# Patient Record
Sex: Female | Born: 1955 | Race: Black or African American | Hispanic: No | Smoking: Never smoker
Health system: Southern US, Community
[De-identification: ages and names within clinical notes are randomized; demographics above are authoritative.]

## PROBLEM LIST (undated history)

## (undated) DIAGNOSIS — C50919 Malignant neoplasm of unspecified site of unspecified female breast: Secondary | ICD-10-CM

## (undated) DIAGNOSIS — Z9889 Other specified postprocedural states: Secondary | ICD-10-CM

## (undated) DIAGNOSIS — R112 Nausea with vomiting, unspecified: Secondary | ICD-10-CM

## (undated) DIAGNOSIS — I10 Essential (primary) hypertension: Secondary | ICD-10-CM

## (undated) HISTORY — PX: PARTIAL HYSTERECTOMY: SHX80

## (undated) HISTORY — PX: APPENDECTOMY: SHX54

## (undated) HISTORY — PX: BREAST SURGERY: SHX581

---

## 2006-04-14 ENCOUNTER — Ambulatory Visit: Payer: Self-pay

## 2007-07-02 ENCOUNTER — Ambulatory Visit: Payer: Self-pay | Admitting: Internal Medicine

## 2007-07-02 ENCOUNTER — Other Ambulatory Visit: Payer: Self-pay

## 2007-08-10 ENCOUNTER — Ambulatory Visit: Payer: Self-pay | Admitting: General Surgery

## 2007-09-29 ENCOUNTER — Ambulatory Visit: Payer: Self-pay | Admitting: Internal Medicine

## 2007-11-02 ENCOUNTER — Ambulatory Visit: Payer: Self-pay | Admitting: Vascular Surgery

## 2011-01-12 ENCOUNTER — Emergency Department: Payer: Self-pay | Admitting: Emergency Medicine

## 2011-12-24 HISTORY — PX: COLONOSCOPY: SHX174

## 2012-12-23 HISTORY — PX: BREAST SURGERY: SHX581

## 2012-12-23 HISTORY — PX: OTHER SURGICAL HISTORY: SHX169

## 2013-04-15 DIAGNOSIS — D0510 Intraductal carcinoma in situ of unspecified breast: Secondary | ICD-10-CM | POA: Insufficient documentation

## 2013-04-15 DIAGNOSIS — Z9889 Other specified postprocedural states: Secondary | ICD-10-CM | POA: Insufficient documentation

## 2015-01-12 ENCOUNTER — Ambulatory Visit: Payer: Self-pay | Admitting: General Practice

## 2015-10-17 ENCOUNTER — Other Ambulatory Visit: Payer: Self-pay | Admitting: Internal Medicine

## 2015-10-17 ENCOUNTER — Encounter: Payer: Self-pay | Admitting: Internal Medicine

## 2015-10-17 DIAGNOSIS — C50919 Malignant neoplasm of unspecified site of unspecified female breast: Secondary | ICD-10-CM | POA: Insufficient documentation

## 2015-10-17 DIAGNOSIS — I6782 Cerebral ischemia: Secondary | ICD-10-CM | POA: Insufficient documentation

## 2015-10-17 DIAGNOSIS — D473 Essential (hemorrhagic) thrombocythemia: Secondary | ICD-10-CM | POA: Insufficient documentation

## 2015-10-17 DIAGNOSIS — I10 Essential (primary) hypertension: Secondary | ICD-10-CM | POA: Insufficient documentation

## 2015-10-17 DIAGNOSIS — G939 Disorder of brain, unspecified: Secondary | ICD-10-CM | POA: Insufficient documentation

## 2015-11-01 ENCOUNTER — Ambulatory Visit: Payer: Self-pay | Admitting: Physician Assistant

## 2015-11-01 ENCOUNTER — Encounter: Payer: Self-pay | Admitting: Physician Assistant

## 2015-11-01 VITALS — BP 110/65 | HR 82 | Temp 98.8°F

## 2015-11-01 DIAGNOSIS — M545 Low back pain, unspecified: Secondary | ICD-10-CM

## 2015-11-01 DIAGNOSIS — N390 Urinary tract infection, site not specified: Secondary | ICD-10-CM

## 2015-11-01 LAB — POCT URINALYSIS DIPSTICK
Bilirubin, UA: NEGATIVE
Blood, UA: NEGATIVE
Glucose, UA: NEGATIVE
KETONES UA: NEGATIVE
NITRITE UA: NEGATIVE
PROTEIN UA: NEGATIVE
Spec Grav, UA: 1.02
UROBILINOGEN UA: 0.2
pH, UA: 6

## 2015-11-01 MED ORDER — CYCLOBENZAPRINE HCL 10 MG PO TABS: 10.0000 mg | ORAL_TABLET | Freq: Three times a day (TID) | ORAL | Status: DC | PRN

## 2015-11-01 MED ORDER — METHYLPREDNISOLONE 4 MG PO TBPK: ORAL_TABLET | ORAL | Status: DC

## 2015-11-01 MED ORDER — CIPROFLOXACIN HCL 500 MG PO TABS: 500.0000 mg | ORAL_TABLET | Freq: Two times a day (BID) | ORAL | Status: DC

## 2015-11-01 NOTE — Progress Notes (Signed)
S: c/o b/l back pain, upper around ribs, radiates around ribs b/l, no cp/sob, no cough or congestion, pain increased with movement, also urinary freq, no fever/chills  O: vitals wnl, nad, spine nontender, ribs nontender, pain reproduced with movement, n/v intact , ua trace leuks  A: back pain, uti  P: flexeril 10mg  tid , medrol dose pack, cipro 500mg  bid

## 2016-09-17 ENCOUNTER — Encounter: Payer: Self-pay | Admitting: Physician Assistant

## 2016-09-17 ENCOUNTER — Ambulatory Visit: Payer: Self-pay | Admitting: Physician Assistant

## 2016-09-17 VITALS — BP 145/84 | HR 62 | Temp 98.4°F

## 2016-09-17 DIAGNOSIS — N390 Urinary tract infection, site not specified: Secondary | ICD-10-CM

## 2016-09-17 LAB — POCT URINALYSIS DIPSTICK
Bilirubin, UA: NEGATIVE
Glucose, UA: NEGATIVE
KETONES UA: NEGATIVE
Nitrite, UA: POSITIVE
PH UA: 5.5
SPEC GRAV UA: 1.02
UROBILINOGEN UA: 1

## 2016-09-17 MED ORDER — NITROFURANTOIN MONOHYD MACRO 100 MG PO CAPS: 100.0000 mg | ORAL_CAPSULE | Freq: Two times a day (BID) | ORAL | 0 refills | Status: DC

## 2016-09-17 NOTE — Progress Notes (Signed)
S:  C/o uti sx for 2 days, burning, urgency, frequency, denies vaginal discharge, abdominal pain or flank pain:  Remainder ros neg  O:  Vitals wnl, nad, no cva tenderness, back nontender, lungs c t a,cv rrr, abd soft nontender, bs normal, n/v intact, ua +nitrites, 2+ blood, 3+ leuks  A: uti  P: macrobid 100mg  bid x 7d, increase water intake, add cranberry juice, return if not improving in 2 -3 days, return earlier if worsening, discussed pyelonephritis sx, added urine culture

## 2016-09-19 LAB — URINE CULTURE

## 2016-11-05 ENCOUNTER — Other Ambulatory Visit: Payer: Self-pay

## 2016-11-05 DIAGNOSIS — N390 Urinary tract infection, site not specified: Secondary | ICD-10-CM

## 2016-11-05 LAB — POCT URINALYSIS DIPSTICK
Bilirubin, UA: NEGATIVE
Blood, UA: NEGATIVE
Glucose, UA: NEGATIVE
KETONES UA: NEGATIVE
Leukocytes, UA: NEGATIVE
Nitrite, UA: NEGATIVE
PH UA: 6.5
PROTEIN UA: NEGATIVE
SPEC GRAV UA: 1.025
Urobilinogen, UA: 0.2

## 2016-11-05 NOTE — Progress Notes (Signed)
Patient just finished her medication for UTI and was told to come to the clinic just to have her urine rechecked when she was done with the medicine.

## 2016-12-13 ENCOUNTER — Ambulatory Visit: Payer: Self-pay | Admitting: Physician Assistant

## 2016-12-13 ENCOUNTER — Encounter: Payer: Self-pay | Admitting: Physician Assistant

## 2016-12-13 VITALS — BP 122/80 | HR 83 | Temp 98.6°F

## 2016-12-13 DIAGNOSIS — J209 Acute bronchitis, unspecified: Secondary | ICD-10-CM

## 2016-12-13 MED ORDER — BENZONATATE 200 MG PO CAPS: 200.0000 mg | ORAL_CAPSULE | Freq: Two times a day (BID) | ORAL | 0 refills | Status: DC | PRN

## 2016-12-13 MED ORDER — AZITHROMYCIN 250 MG PO TABS: ORAL_TABLET | ORAL | 0 refills | Status: DC

## 2016-12-13 MED ORDER — HYDROCOD POLST-CPM POLST ER 10-8 MG/5ML PO SUER: 5.0000 mL | Freq: Two times a day (BID) | ORAL | 0 refills | Status: DC | PRN

## 2016-12-13 NOTE — Progress Notes (Signed)
S: C/o cough and congestion with with dry cough; denies fever, chills, mucus is green/clear but rarely gets any out, mostly the cough is dry and hacking; keeping pt awake at night;  denies cardiac type chest pain or sob, v/d, abd pain Remainder ros neg  O: vitals wnl, nad, tms clear, throat injected, neck supple no lymph, lungs c t a, cv rrr, neuro intact, cough is dry and hacking  A:  Acute bronchitis   P:  rx medication: zpack, tessalon perls for during the day at work, tussionex 160ml nr, beware sedation;  use otc meds, tylenol or motrin as needed for fever/chills, return if not better in 3 -5 days, return earlier if worsening

## 2017-01-14 ENCOUNTER — Ambulatory Visit: Payer: Self-pay | Admitting: Physician Assistant

## 2017-01-14 VITALS — BP 120/79 | HR 62 | Temp 98.2°F

## 2017-01-14 DIAGNOSIS — M436 Torticollis: Secondary | ICD-10-CM

## 2017-01-14 MED ORDER — CYCLOBENZAPRINE HCL 5 MG PO TABS: ORAL_TABLET | ORAL | 1 refills | Status: DC

## 2017-01-14 MED ORDER — IBUPROFEN 800 MG PO TABS: 800.0000 mg | ORAL_TABLET | Freq: Three times a day (TID) | ORAL | 0 refills | Status: DC

## 2017-01-14 NOTE — Progress Notes (Signed)
S: over the weekend patient began having neck pain that prevented her from moving her neck.  No hx of injury.  No prior problems with neck but has had back problems.  Thought it was because she turned a fan on.   Took family's ibu 800mg  once a day since Saturday because they only had 4 to give her.  She was able to go to work with the ibu 800mg  but now is starting to have pain again and decreased ROM.  No fever or sickness.    O: Marked tenderness on palpation of the cervical spine and bilat. paravetebral muscles.  ROM restricted due to pain.  No erythema, eccyhmosis or abrasions.  Lungs Clear, Heart RRR  A: Torticollis  P: Flexeril 5mg  at hs and ibuprofen 800mg  tid with food.  Use heat or ice to neck.  Return if not improving

## 2017-01-15 ENCOUNTER — Telehealth: Payer: Self-pay | Admitting: Emergency Medicine

## 2017-01-15 MED ORDER — METHYLPREDNISOLONE 4 MG PO TBPK: ORAL_TABLET | ORAL | 0 refills | Status: DC

## 2017-01-15 NOTE — Telephone Encounter (Signed)
Patient wants to add the steroid.  Please send script to [facility] on [address].

## 2017-01-15 NOTE — Telephone Encounter (Signed)
Pt was seen in clinic yesterday for neck spasms and back pain, states the muscle relaxer isn't really helping a lot, called in medrol dose pack as this has worked for the patient in the past

## 2017-01-31 ENCOUNTER — Encounter: Payer: Self-pay | Admitting: Physician Assistant

## 2017-01-31 ENCOUNTER — Ambulatory Visit: Payer: Self-pay | Admitting: Physician Assistant

## 2017-01-31 VITALS — BP 132/90 | HR 55 | Temp 98.3°F

## 2017-01-31 DIAGNOSIS — G8929 Other chronic pain: Secondary | ICD-10-CM

## 2017-01-31 DIAGNOSIS — M542 Cervicalgia: Principal | ICD-10-CM

## 2017-01-31 MED ORDER — IBUPROFEN 800 MG PO TABS: 800.0000 mg | ORAL_TABLET | Freq: Three times a day (TID) | ORAL | 0 refills | Status: DC

## 2017-01-31 MED ORDER — METHYLPREDNISOLONE 4 MG PO TBPK: ORAL_TABLET | ORAL | 0 refills | Status: DC

## 2017-01-31 MED ORDER — BACLOFEN 10 MG PO TABS: 10.0000 mg | ORAL_TABLET | Freq: Three times a day (TID) | ORAL | 0 refills | Status: DC

## 2017-01-31 NOTE — Progress Notes (Signed)
S: c/o continued neck and shoulder pain, no numbness or tingling, no known injury, had a physical therapist do a heat treatment while she was there with her mother and the pain went away, has taken flexeril, medrol dose pack, and ibuprofen, felt best when on the steroid  O: vitals wnl, nad, cspine nontender, + spasms throughout the shoulderes, full rom of neck , grips = b/l, n/v intact  A: chronic neck and shoulder pain  P: medrol dose pack, baclofen, ibuprofen when done with steroid pack, will refer to PT

## 2017-02-15 ENCOUNTER — Ambulatory Visit
Admission: EM | Admit: 2017-02-15 | Discharge: 2017-02-15 | Disposition: A | Discharge status: Home / Self Care | Payer: Managed Care, Other (non HMO) | Attending: Emergency Medicine | Admitting: Emergency Medicine

## 2017-02-15 ENCOUNTER — Encounter: Payer: Self-pay | Admitting: Gynecology

## 2017-02-15 ENCOUNTER — Ambulatory Visit (INDEPENDENT_AMBULATORY_CARE_PROVIDER_SITE_OTHER): Payer: Managed Care, Other (non HMO)

## 2017-02-15 DIAGNOSIS — M109 Gout, unspecified: Secondary | ICD-10-CM

## 2017-02-15 HISTORY — DX: Malignant neoplasm of unspecified site of unspecified female breast: C50.919

## 2017-02-15 HISTORY — DX: Essential (primary) hypertension: I10

## 2017-02-15 MED ORDER — NAPROXEN 500 MG PO TABS: 500.0000 mg | ORAL_TABLET | Freq: Two times a day (BID) | ORAL | 0 refills | Status: AC

## 2017-02-15 NOTE — ED Triage Notes (Signed)
Per patient left big toe foot pain x 3 days ago. Per patient not injury to toe or foot.

## 2017-02-15 NOTE — ED Provider Notes (Signed)
CSN: [id number]     Arrival date & time 02/15/17  [id number] History   First MD Initiated Contact with Patient 02/15/17 1115     Chief Complaint  Patient presents with  . Foot Pain   (Consider location/radiation/quality/duration/timing/severity/associated sxs/prior Treatment) HPI  61 year old female who presents with left great toe pain for the last 3 days which is increasing in severity. Currently to 7 out of 10. Denies any injury to her foot or toe. She has not increased activity. Is a very strong family history of gout including her mother and both of her brothers.        Past Medical History:  Diagnosis Date  . Breast cancer ([place])   . Hypertension    Past Surgical History:  Procedure Laterality Date  . APPENDECTOMY    . BREAST SURGERY    . COLONOSCOPY  2013   normal  . Left lumpectomy  2014  . PARTIAL HYSTERECTOMY     Family History  Problem Relation Age of Onset  . Hypertension Mother   . Diabetes Mother   . Gout Mother   . Diabetes Brother   . Gout Brother   . Lung cancer Father    Social History  Substance Use Topics  . Smoking status: Never Smoker  . Smokeless tobacco: Never Used  . Alcohol use No   OB History    No data available     Review of Systems  Constitutional: Positive for activity change. Negative for chills, fatigue and fever.  Musculoskeletal: Positive for arthralgias and joint swelling.  All other systems reviewed and are negative.   Allergies  Tape  Home Medications   Prior to Admission medications   Medication Sig Start Date End Date Taking? Authorizing Provider  amLODipine (NORVASC) 10 MG tablet Take 1 tablet by mouth daily. 01/03/15  Yes Historical Provider, MD  aspirin 81 MG chewable tablet Chew 81 mg by mouth.   Yes Historical Provider, MD  lisinopril-hydrochlorothiazide (PRINZIDE,ZESTORETIC) 20-25 MG tablet Take 1 tablet by mouth daily. 01/03/15  Yes Historical Provider, MD  naproxen (NAPROSYN) 500 MG tablet Take 1 tablet (500 mg  total) by mouth 2 (two) times daily with a meal. 02/15/17   Lorin Picket, PA-C   Meds Ordered and Administered this Visit  Medications - No data to display  BP 132/80 (BP Location: Left Arm)   Pulse (!) 54   Temp 98.2 F (36.8 C) (Oral)   Resp 16   Ht 5\' 7"  (1.702 m)   Wt 205 lb (93 kg)   SpO2 100%   BMI 32.11 kg/m  No data found.   Physical Exam  Constitutional: She is oriented to person, place, and time. She appears well-developed and well-nourished. No distress.  HENT:  Head: Normocephalic and atraumatic.  Eyes: Pupils are equal, round, and reactive to light. Right eye exhibits no discharge. Left eye exhibits no discharge.  Neck: Normal range of motion. Neck supple.  Musculoskeletal: She exhibits edema and tenderness.  Examination of the right great toe shows some mild swelling. There is warmth present over the MTP joint. She is very reluctant to have any pressure on the joint. No pain over the IP joint. There is no pain along the metatarsal head or neck. Ankle and subtalar motion is comfortable. She has no calcaneal wide tenderness.  Neurological: She is alert and oriented to person, place, and time.  Skin: Skin is warm and dry. She is not diaphoretic.  Psychiatric: She has a normal mood and affect.  Her behavior is normal. Judgment and thought content normal.  Nursing note and vitals reviewed.   Urgent Care Course     Procedures (including critical care time)  Labs Review Labs Reviewed - No data to display  Imaging Review Dg Toe Great Right  Result Date: 02/15/2017 CLINICAL DATA:  Right great toe pain. No known injury. Possible gout. EXAM: RIGHT GREAT TOE COMPARISON:  None. FINDINGS: There is no evidence of fracture or dislocation. There is no evidence of arthropathy or other focal bone abnormality. Soft tissues are unremarkable. IMPRESSION: Negative exam. Electronically Signed   By: Inge Rise M.D.   On: 02/15/2017 12:06     Visual Acuity Review  Right  Eye Distance:   Left Eye Distance:   Bilateral Distance:    Right Eye Near:   Left Eye Near:    Bilateral Near:     Patient was given a right postop shoe    MDM   1. Acute gout involving toe of right foot, unspecified cause    New Prescriptions   NAPROXEN (NAPROSYN) 500 MG TABLET    Take 1 tablet (500 mg total) by mouth 2 (two) times daily with a meal.  Plan: 1. Test/x-ray results and diagnosis reviewed with patient 2. rx as per orders; risks, benefits, potential side effects reviewed with patient 3. Recommend supportive treatment with Elevation and ice. Watch diet closely. Injections were provided to the patient. I've advised her she should follow-up with her primary care physician for ongoing care. She is provided with a postop shoe to help assist with ambulation. 4. F/u prn if symptoms worsen or don't improve     Lorin Picket, PA-C 02/15/17 1236

## 2017-05-15 ENCOUNTER — Encounter: Payer: Self-pay | Admitting: Physician Assistant

## 2017-05-15 ENCOUNTER — Ambulatory Visit: Payer: Self-pay | Admitting: Physician Assistant

## 2017-05-15 VITALS — BP 120/80 | HR 55 | Temp 98.5°F | Ht 67.0 in | Wt 196.0 lb

## 2017-05-15 DIAGNOSIS — Z0000 Encounter for general adult medical examination without abnormal findings: Secondary | ICD-10-CM

## 2017-05-15 MED ORDER — IBUPROFEN 800 MG PO TABS: 800.0000 mg | ORAL_TABLET | Freq: Three times a day (TID) | ORAL | 3 refills | Status: AC | PRN

## 2017-05-15 MED ORDER — IBUPROFEN 800 MG PO TABS: 800.0000 mg | ORAL_TABLET | Freq: Three times a day (TID) | ORAL | 3 refills | Status: DC | PRN

## 2017-05-15 NOTE — Progress Notes (Signed)
S: pt here for wellness physical had biometrics for insurance purposes done at work, no complaints ros neg. PMH:  Htn, breast ca remission followed at [facility];   Social: nonsmoker, no etoh, no drugs  Fam: htn dm, leukemia in pgm  O: vitals wnl, nad, ENT wnl, neck supple no lymph, lungs c t a, cv rrr, abd soft nontender bs normal all 4 quads  A: wellness  physical  P: f/u with pcp as needed, breast cancer clinic at [facility] yearly, return as needed

## 2017-12-12 DIAGNOSIS — D708 Other neutropenia: Secondary | ICD-10-CM | POA: Insufficient documentation

## 2019-10-14 ENCOUNTER — Ambulatory Visit (LOCAL_COMMUNITY_HEALTH_CENTER): Payer: Self-pay

## 2019-10-14 ENCOUNTER — Other Ambulatory Visit: Payer: Self-pay

## 2019-10-14 DIAGNOSIS — Z23 Encounter for immunization: Secondary | ICD-10-CM

## 2020-05-01 ENCOUNTER — Encounter: Payer: Self-pay | Admitting: *Deleted

## 2020-05-01 ENCOUNTER — Emergency Department: Payer: Managed Care, Other (non HMO)

## 2020-05-01 ENCOUNTER — Other Ambulatory Visit: Payer: Self-pay

## 2020-05-01 ENCOUNTER — Emergency Department
Admission: EM | Admit: 2020-05-01 | Discharge: 2020-05-01 | Disposition: A | Discharge status: Home / Self Care | Payer: Managed Care, Other (non HMO) | Attending: Student in an Organized Health Care Education/Training Program | Admitting: Student in an Organized Health Care Education/Training Program

## 2020-05-01 ENCOUNTER — Ambulatory Visit: Payer: Managed Care, Other (non HMO) | Admitting: Physician Assistant

## 2020-05-01 VITALS — BP 132/80 | HR 60 | Temp 98.2°F | Resp 16 | Ht 67.0 in | Wt 190.0 lb

## 2020-05-01 DIAGNOSIS — R109 Unspecified abdominal pain: Secondary | ICD-10-CM | POA: Insufficient documentation

## 2020-05-01 DIAGNOSIS — M549 Dorsalgia, unspecified: Secondary | ICD-10-CM | POA: Diagnosis not present

## 2020-05-01 DIAGNOSIS — Z853 Personal history of malignant neoplasm of breast: Secondary | ICD-10-CM | POA: Diagnosis not present

## 2020-05-01 DIAGNOSIS — Z7982 Long term (current) use of aspirin: Secondary | ICD-10-CM | POA: Diagnosis not present

## 2020-05-01 DIAGNOSIS — I10 Essential (primary) hypertension: Secondary | ICD-10-CM | POA: Diagnosis not present

## 2020-05-01 DIAGNOSIS — Z79899 Other long term (current) drug therapy: Secondary | ICD-10-CM | POA: Diagnosis not present

## 2020-05-01 LAB — URINALYSIS, COMPLETE (UACMP) WITH MICROSCOPIC
Bacteria, UA: NONE SEEN
Bilirubin Urine: NEGATIVE
Glucose, UA: NEGATIVE mg/dL
Hgb urine dipstick: NEGATIVE
Ketones, ur: NEGATIVE mg/dL
Leukocytes,Ua: NEGATIVE
Nitrite: NEGATIVE
Protein, ur: NEGATIVE mg/dL
Specific Gravity, Urine: 1.016 (ref 1.005–1.030)
pH: 5 (ref 5.0–8.0)

## 2020-05-01 LAB — CBC
HCT: 42.3 % (ref 36.0–46.0)
Hemoglobin: 14.1 g/dL (ref 12.0–15.0)
MCH: 28.6 pg (ref 26.0–34.0)
MCHC: 33.3 g/dL (ref 30.0–36.0)
MCV: 85.8 fL (ref 80.0–100.0)
Platelets: 515 10*3/uL — ABNORMAL HIGH (ref 150–400)
RBC: 4.93 MIL/uL (ref 3.87–5.11)
RDW: 14.6 % (ref 11.5–15.5)
WBC: 6.6 10*3/uL (ref 4.0–10.5)
nRBC: 0 % (ref 0.0–0.2)

## 2020-05-01 LAB — COMPREHENSIVE METABOLIC PANEL
ALT: 21 U/L (ref 0–44)
AST: 23 U/L (ref 15–41)
Albumin: 4.2 g/dL (ref 3.5–5.0)
Alkaline Phosphatase: 106 U/L (ref 38–126)
Anion gap: 10 (ref 5–15)
BUN: 25 mg/dL — ABNORMAL HIGH (ref 8–23)
CO2: 26 mmol/L (ref 22–32)
Calcium: 9.7 mg/dL (ref 8.9–10.3)
Chloride: 104 mmol/L (ref 98–111)
Creatinine, Ser: 0.91 mg/dL (ref 0.44–1.00)
GFR calc Af Amer: >60 mL/min (ref >60)
GFR calc non Af Amer: >60 mL/min (ref >60)
Glucose, Bld: 107 mg/dL — ABNORMAL HIGH (ref 70–99)
Potassium: 3.6 mmol/L (ref 3.5–5.1)
Sodium: 140 mmol/L (ref 135–145)
Total Bilirubin: 0.6 mg/dL (ref 0.3–1.2)
Total Protein: 8.1 g/dL (ref 6.5–8.1)

## 2020-05-01 LAB — TROPONIN I (HIGH SENSITIVITY): Troponin I (High Sensitivity): 6 ng/L (ref <18)

## 2020-05-01 MED ORDER — HYDROCODONE-ACETAMINOPHEN 5-325 MG PO TABS
1.0000 | ORAL_TABLET | Freq: Once | ORAL | Status: AC
  Administered 2020-05-01: 1 via ORAL
  Filled 2020-05-01: qty 1

## 2020-05-01 MED ORDER — ONDANSETRON HCL 4 MG/2ML IJ SOLN
4.0000 mg | Freq: Once | INTRAMUSCULAR | Status: AC
  Administered 2020-05-01: 4 mg via INTRAVENOUS
  Filled 2020-05-01: qty 2

## 2020-05-01 MED ORDER — CYCLOBENZAPRINE HCL 10 MG PO TABS
5.0000 mg | ORAL_TABLET | Freq: Once | ORAL | Status: AC
  Administered 2020-05-01: 5 mg via ORAL
  Filled 2020-05-01: qty 1

## 2020-05-01 MED ORDER — MORPHINE SULFATE (PF) 4 MG/ML IV SOLN
4.0000 mg | INTRAVENOUS | Status: DC | PRN
Start: 2020-05-01 — End: 2020-05-02
  Administered 2020-05-01: 4 mg via INTRAVENOUS
  Filled 2020-05-01 (×2): qty 1

## 2020-05-01 MED ORDER — CYCLOBENZAPRINE HCL 5 MG PO TABS
5.0000 mg | ORAL_TABLET | Freq: Three times a day (TID) | ORAL | 0 refills | Status: DC | PRN
Start: 2020-05-01 — End: 2022-01-17

## 2020-05-01 MED ORDER — POLYETHYLENE GLYCOL 3350 17 G PO PACK
17.0000 g | PACK | Freq: Every day | ORAL | 0 refills | Status: DC
Start: 2020-05-01 — End: 2022-01-17

## 2020-05-01 MED ORDER — HYDROCODONE-ACETAMINOPHEN 5-325 MG PO TABS: 1.0000 | ORAL_TABLET | ORAL | 0 refills | Status: DC | PRN

## 2020-05-01 MED ORDER — IOHEXOL 350 MG/ML SOLN
100.0000 mL | Freq: Once | INTRAVENOUS | Status: AC | PRN
  Administered 2020-05-01: 100 mL via INTRAVENOUS
  Filled 2020-05-01: qty 100

## 2020-05-01 NOTE — ED Notes (Signed)
Pt to CT

## 2020-05-01 NOTE — Progress Notes (Signed)
Subjective:    Patient ID: Deborah Jackson, female    DOB: [birth year], 64 y.o.   MRN: [id number]  HPI : ALLERGIC TO ADHESIVES reported  64 yo AA F presents c/o severe upper mid back pain increasing over the past 24 hours, increasing , escalating frequency spasm episodes. Radiates through to anterior chest .Now with intermittent stabbing spasm discomfort causing her to scream in pain. Occurs ever few minutes-  She denies NVD, diaphoresis, SOB, dizziness  . Reports she has voided- did not notice any color change. Has not been drinking, not thirsty. 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 Has not sought care because " I put up with pain" Pain has been lower mid back  and much less intense. Has had Ibuprofen 2 tablets and Aleve 2 tablets in the past 2 days. 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  Presents at this time in a wheelchair from the front door. Intermittently writhing in discomfort and calling out in pain.  Pain is described as stabbing in upper mid-back , now reflecting down left flank to left abdomen. Has grown steadily more intense over the day. Had a breakfast sandwich but has had no appetite since  Had covid shot 02/22/20 ( unsure re #2) Had flu shot 10/14/19  Hysterectomy age 64 yo  Patient on [facility] My Chart- defers our My Chart at this time Briefly discussed communication connection  Review of Systems Denies hx of kidney stones Left mastectomy Hysterectomy age 64    Objective:   Physical Exam Vitals and nursing note reviewed.  Constitutional:      General: She is in acute distress.     Appearance: She is obese.     Comments: patient presents in wheel chair- transport from main lobby  190 lb most recent reported  HENT:     Head:  Normocephalic and atraumatic.     Mouth/Throat:     Mouth: Mucous membranes are moist.  Eyes:     Extraocular Movements: Extraocular movements intact.  Cardiovascular:     Rate and Rhythm: Normal rate and regular rhythm.     Heart sounds: Normal heart sounds.  Pulmonary:     Effort: Pulmonary effort is normal.     Breath sounds: Normal breath sounds.     Comments: Left mastectomy Abdominal:     Tenderness: There is abdominal tenderness. There is left CVA tenderness and guarding.     Comments: Obese,protuberant  C/o intense LCVAT with gentle percussion  Exam abdomen limited - chair - patient defers palpation  Musculoskeletal:     Cervical back: Normal range of motion.  Skin:    General: Skin is warm and dry.  Neurological:     Mental Status: She is alert and oriented to person, place, and time.  Psychiatric:     Comments: Distressed , complains of severe pain       Assessment & Plan:   This outpatient clinic not appropriate for evaluation. No diagnostics avail, very limited pain therapy,no IVs available,  No Xray- labs would need transport, No STATS available..   Have discussed with patient and I recommend that she allow [place] to transport to ER  via [facility] and she is in agreement. ER triage staff was consulted and are aware. Nursing staff  transported w/c , reported back pain continued with exacerbation with any motion of the chair on transport.

## 2020-05-01 NOTE — ED Provider Notes (Signed)
[facility] Emergency Department Provider Note    First MD Initiated Contact with Patient 05/01/20 1826     (approximate)  I have reviewed the triage vital signs and the nursing notes.   HISTORY  Chief Complaint Flank Pain    HPI Deborah Jackson is a 64 y.o. female with the below listed past medical history presents to the ER for 2 weeks of progressively worsening severe back pain.  States that she initially did have some chest pain that was radiating through to the back and is now having spasms anytime she moves.  Denies any history of status.  No history of fevers or chills with this.  Denies any recent heavy lifting or trauma.  Denies any numbness or tingling.  Is got to the point where she can no longer walk due to the pain.  Does have a history of hypertension.  Not on any blood thinners.  Does have a history of thrombocythemia followed at [facility] for this.   Does take ASA.    Past Medical History:  Diagnosis Date  . Breast cancer ([place])   . Hypertension    Family History  Problem Relation Age of Onset  . Hypertension Mother   . Diabetes Mother   . Gout Mother   . Diabetes Brother   . Gout Brother   . Hypertension Brother   . Lung cancer Father   . Hypertension Brother    Past Surgical History:  Procedure Laterality Date  . APPENDECTOMY    . BREAST SURGERY    . COLONOSCOPY  2013   normal  . Left lumpectomy  2014  . PARTIAL HYSTERECTOMY     Patient Active Problem List   Diagnosis Date Noted  . Essential thrombocythemia ([place]) 10/17/2015  . Benign hypertension 10/17/2015  . Primary malignant neoplasm of female breast ([place]) 10/17/2015  . Temporary cerebral vascular dysfunction 10/17/2015      Prior to Admission medications   Medication Sig Start Date End Date Taking? Authorizing Provider  amLODipine (NORVASC) 10 MG tablet Take 1 tablet by mouth daily. 01/03/15   [provider]  aspirin 81 MG chewable tablet Chew 81 mg by  mouth.    [provider]  cyclobenzaprine (FLEXERIL) 5 MG tablet Take 1 tablet (5 mg total) by mouth 3 (three) times daily as needed for muscle spasms. 05/01/20   Merlyn Lot, MD  HYDROcodone-acetaminophen (NORCO) 5-325 MG tablet Take 1 tablet by mouth every 4 (four) hours as needed for moderate pain. 05/01/20   Merlyn Lot, MD  ibuprofen (ADVIL,MOTRIN) 800 MG tablet Take 1 tablet (800 mg total) by mouth every 8 (eight) hours as needed. 05/15/17   Fisher, Linden Dolin, PA-C  lisinopril-hydrochlorothiazide (PRINZIDE,ZESTORETIC) 20-25 MG tablet Take 1 tablet by mouth daily. 01/03/15   [provider]  naproxen (NAPROSYN) 500 MG tablet Take 1 tablet (500 mg total) by mouth 2 (two) times daily with a meal. Patient not taking: Reported on 05/01/2020 02/15/17   Crecencio Mc P, PA-C  polyethylene glycol (MIRALAX / GLYCOLAX) 17 g packet Take 17 g by mouth daily. Mix one tablespoon with 8oz of your favorite juice or water every day until you are having soft formed stools. Then start taking once daily if you didn't have a stool the day before. 05/01/20   Merlyn Lot, MD    Allergies Tape    Social History Social History   Tobacco Use  . Smoking status: Never Smoker  . Smokeless tobacco: Never Used  Substance Use Topics  . Alcohol use: No    Alcohol/week: 0.0 standard drinks  . Drug use: No    Review of Systems Patient denies headaches, rhinorrhea, blurry vision, numbness, shortness of breath, chest pain, edema, cough, abdominal pain, nausea, vomiting, diarrhea, dysuria, fevers, rashes or hallucinations unless otherwise stated above in HPI. ____________________________________________   PHYSICAL EXAM:  VITAL SIGNS: Vitals:   05/01/20 1955 05/01/20 2055  BP: 125/75 105/75  Pulse: 68 76  Resp: 20 20  Temp:    SpO2: 97% 98%    Constitutional: Alert and oriented. Uncomfortable appearing Eyes: Conjunctivae are normal.  Head: Atraumatic. Nose: No  congestion/rhinnorhea. Mouth/Throat: Mucous membranes are moist.   Neck: No stridor. Painless ROM.  Cardiovascular: Normal rate, regular rhythm. Grossly normal heart sounds.  Good peripheral circulation. Respiratory: Normal respiratory effort.  No retractions. Lungs CTAB. Gastrointestinal: Soft and nontender. No distention. No abdominal bruits. No CVA tenderness. Genitourinary: deferred Musculoskeletal: Significant pain with palpation of the low thoracic to upper lumbar spine and left paraspinal muscles.  No overlying warmth cellulitis or lesion noted.  No lower extremity tenderness nor edema.  No joint effusions. Neurologic:  Normal speech and language. No gross focal neurologic deficits are appreciated. No facial droop Skin:  Skin is warm, dry and intact. No rash noted. Psychiatric: Mood and affect are normal. Speech and behavior are normal.  ____________________________________________   LABS (all labs ordered are listed, but only abnormal results are displayed)  Results for orders placed or performed during the hospital encounter of 05/01/20 (from the past 24 hour(s))  Comprehensive metabolic panel     Status: Abnormal   Collection Time: 05/01/20  5:20 PM  Result Value Ref Range   Sodium 140 135 - 145 mmol/L   Potassium 3.6 3.5 - 5.1 mmol/L   Chloride 104 98 - 111 mmol/L   CO2 26 22 - 32 mmol/L   Glucose, Bld 107 (H) 70 - 99 mg/dL   BUN 25 (H) 8 - 23 mg/dL   Creatinine, Ser 0.91 0.44 - 1.00 mg/dL   Calcium 9.7 8.9 - 10.3 mg/dL   Total Protein 8.1 6.5 - 8.1 g/dL   Albumin 4.2 3.5 - 5.0 g/dL   AST 23 15 - 41 U/L   ALT 21 0 - 44 U/L   Alkaline Phosphatase 106 38 - 126 U/L   Total Bilirubin 0.6 0.3 - 1.2 mg/dL   GFR calc non Af Amer >60 >60 mL/min   GFR calc Af Amer >60 >60 mL/min   Anion gap 10 5 - 15  CBC     Status: Abnormal   Collection Time: 05/01/20  5:20 PM  Result Value Ref Range   WBC 6.6 4.0 - 10.5 K/uL   RBC 4.93 3.87 - 5.11 MIL/uL   Hemoglobin 14.1 12.0 - 15.0  g/dL   HCT 42.3 36.0 - 46.0 %   MCV 85.8 80.0 - 100.0 fL   MCH 28.6 26.0 - 34.0 pg   MCHC 33.3 30.0 - 36.0 g/dL   RDW 14.6 11.5 - 15.5 %   Platelets 515 (H) 150 - 400 K/uL   nRBC 0.0 0.0 - 0.2 %  Urinalysis, Complete w Microscopic     Status: Abnormal   Collection Time: 05/01/20  5:20 PM  Result Value Ref Range   Color, Urine YELLOW (A) YELLOW   APPearance CLEAR (A) CLEAR   Specific Gravity, Urine 1.016 1.005 - 1.030   pH 5.0 5.0 - 8.0   Glucose, UA NEGATIVE NEGATIVE mg/dL  Hgb urine dipstick NEGATIVE NEGATIVE   Bilirubin Urine NEGATIVE NEGATIVE   Ketones, ur NEGATIVE NEGATIVE mg/dL   Protein, ur NEGATIVE NEGATIVE mg/dL   Nitrite NEGATIVE NEGATIVE   Leukocytes,Ua NEGATIVE NEGATIVE   WBC, UA 0-5 0 - 5 WBC/hpf   Bacteria, UA NONE SEEN NONE SEEN   Squamous Epithelial / LPF 0-5 0 - 5   Mucus PRESENT   Troponin I (High Sensitivity)     Status: None   Collection Time: 05/01/20  5:20 PM  Result Value Ref Range   Troponin I (High Sensitivity) 6 <18 ng/L   ____________________________________________  EKG My review and personal interpretation at Time: 18:48   Indication: back pain  Rate: 60  Rhythm: sinus Axis: normal Other: normal intervals, no stemi, nonspecific st abn, motion artifact ____________________________________________  RADIOLOGY  I personally reviewed all radiographic images ordered to evaluate for the above acute complaints and reviewed radiology reports and findings.  These findings were personally discussed with the patient.  Please see medical record for radiology report.  ____________________________________________   PROCEDURES  Procedure(s) performed:  Procedures    Critical Care performed: no ____________________________________________   INITIAL IMPRESSION / ASSESSMENT AND PLAN / ED COURSE  Pertinent labs & imaging results that were available during my care of the patient were reviewed by me and considered in my medical decision making (see  chart for details).   DDX: Musculoskeletal strain, stone, pyelonephritis, pneumonia, dissection, ACS, fracture, shingles, hemorrhage, hematoma  Deborah Jackson is a 64 y.o. who presents to the ED with symptoms as described above.  Patient very pleasant but is very uncomfortable appearing.  Pain is somewhat reproducible with palpation of the left flank but the patient also reported some chest discomfort over the past few days and I am concerned with her hypertension as could be sign of dissection.  Given her comfort level will order IV pain medication order CT angiogram to evaluate for acute intra thoracic or abdominal process.  The patient will be placed on continuous pulse oximetry and telemetry for monitoring.  Laboratory evaluation will be sent to evaluate for the above complaints.     Clinical Course as of May 01 2057  Mon May 01, 2020  2000 Work-up is fortunately reassuring.  Suspect some component of musculoskeletal spasm.  Again there is no signs of shingles or overlying erythema.  Will trial muscle relaxant as well as oral pain medication.   [PR]  2040 Patient feels significantly improved.  Do not feel that repeat troponin indicated she is not having any chest pain and that episode was several days ago.  Presentation most c/w muscle spasm. At this point I do believe she stable and appropriate for outpatient follow-up.  Have discussed with the patient and available family all diagnostics and treatments performed thus far and all questions were answered to the best of my ability. The patient demonstrates understanding and agreement with plan.    [PR]    Clinical Course User Index [PR] Merlyn Lot, MD    The patient was evaluated in Emergency Department today for the symptoms described in the history of present illness. He/she was evaluated in the context of the global COVID-19 pandemic, which necessitated consideration that the patient might be at risk for infection with the  SARS-CoV-2 virus that causes COVID-19. Institutional protocols and algorithms that pertain to the evaluation of patients at risk for COVID-19 are in a state of rapid change based on information released by regulatory bodies including the CDC and federal  and state organizations. These policies and algorithms were followed during the patient's care in the ED.  As part of my medical decision making, I reviewed the following data within the [facility] notes reviewed and incorporated, Labs reviewed, notes from prior ED visits and [place] Controlled Substance Database   ____________________________________________   FINAL CLINICAL IMPRESSION(S) / ED DIAGNOSES  Final diagnoses:  Flank pain      NEW MEDICATIONS STARTED DURING THIS VISIT:  Discharge Medication List as of 05/01/2020  8:42 PM    START taking these medications   Details  cyclobenzaprine (FLEXERIL) 5 MG tablet Take 1 tablet (5 mg total) by mouth 3 (three) times daily as needed for muscle spasms., Starting Mon 05/01/2020, Normal    HYDROcodone-acetaminophen (NORCO) 5-325 MG tablet Take 1 tablet by mouth every 4 (four) hours as needed for moderate pain., Starting Mon 05/01/2020, Normal    polyethylene glycol (MIRALAX / GLYCOLAX) 17 g packet Take 17 g by mouth daily. Mix one tablespoon with 8oz of your favorite juice or water every day until you are having soft formed stools. Then start taking once daily if you didn't have a stool the day before., Starting Mon 05/01/2020, Normal         Note:  This document was prepared using Dragon voice recognition software and may include unintentional dictation errors.    Merlyn Lot, MD 05/01/20 [id number]

## 2020-05-01 NOTE — ED Triage Notes (Signed)
Pt reports left flank pain for 2 weeks.  Reports feeling a cramp in left back.  No n/v/  No hx kidney stones.  No urinary sx. Pt alert.  Speech clear.

## 2020-05-01 NOTE — Discharge Instructions (Addendum)
You have been seen and diagnosed with back pain today in the Emergency Department.  Medicines: Please take the medicine as prescribed. Call your primary healthcare provider if you think your medicine is not working as expected or if you feel you need more. I do not generally provide refills on pain medications in the ED because I don't monitor you on a long-term basis. Your primary care doctor does. Do not drive or operate heavy machinery while taking narcotic pain medications.  Self-care: Exercise: Gentle exercise may help decrease your pain. Start with some light exercises such as walking, biking, or swimming during the first 2 weeks. Ask for more information about the activities or exercises that are right for you. Maintain a healthy weight.  Ice: Ice helps decrease swelling, pain, and muscle spasm. Put crushed ice in a plastic bag. Cover it with a towel. Place it on your lower back for 20 to 30 minutes every 2 hours until improvement.  Heat: Heat helps decrease pain and muscle spasms. Use a small towel dampened with warm water or a heat pad, or sit in a warm bath. Apply heat on the area for 20 to 30 minutes every 2 hours until improvement. Alternate heat and ice.  Physical therapy: You may need to see a physical therapist to teach you special exercises. These exercises help improve movement and decrease pain. Physical therapy can also help improve strength and decrease your risk for loss of function. Please follow-up with your PCP about physical therapy.  Contact your primary healthcare provider or orthopedist if:  You have a fever.  You have pain at night or when you rest.  Your pain does not get better with treatment.  You have pain that worsens when you cough, sneeze, or strain your back.  You suddenly feel something pop or snap in your back.  You have questions or concerns about your condition or care.  Please return to the emergency department if:  You have severe pain.  You have  sudden stiffness or heaviness to buttocks down to both legs.  You have numbness or weakness in one leg, or pain in both legs.  You have numbness in your genital area or across your lower back.  You cannot control your urine or bowel movements.    IMPRESSION: 1. Negative for aortic dissection or aortic aneurysm. 2. No CT evidence for acute intrathoracic, intra-abdominal, or intrapelvic abnormality. 3. 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     Electronically Signed   By: Donavan Foil M.D.   On: 05/01/2020 19:46

## 2020-05-01 NOTE — ED Notes (Signed)
Patient discharged to home per MD order. Patient in stable condition, and deemed medically cleared by ED provider for discharge. Discharge instructions reviewed with patient/family using "Teach Back"; verbalized understanding of medication education and administration, and information about follow-up care. Denies further concerns. ° °

## 2020-10-15 IMAGING — CT CT ANGIO CHEST-ABD-PELV FOR DISSECTION W/ AND WO/W CM
2 of 7 series · 14 of 46 positions shown, 16 images · IV contrast (APPLIED)
Comparison: Chest x-ray 01/12/2011

CLINICAL DATA: Chest pain radiating to the back

EXAM:
CT ANGIOGRAPHY CHEST, ABDOMEN AND PELVIS
TECHNIQUE: Non-contrast CT of the chest was initially obtained.

[Series 6: coronals · coronal · 0.73mm/px · 3 of 153 slices shown]
[im 39/153  soft-tissue]
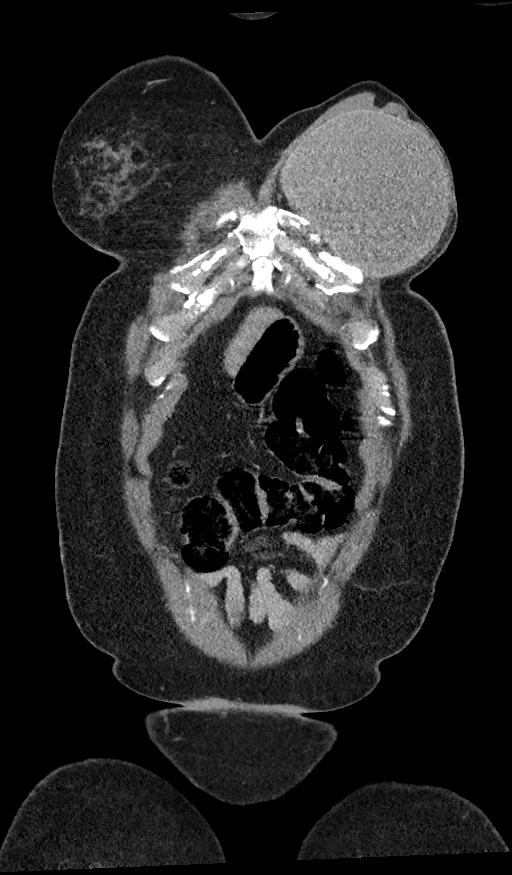
[im 77/153  soft-tissue]
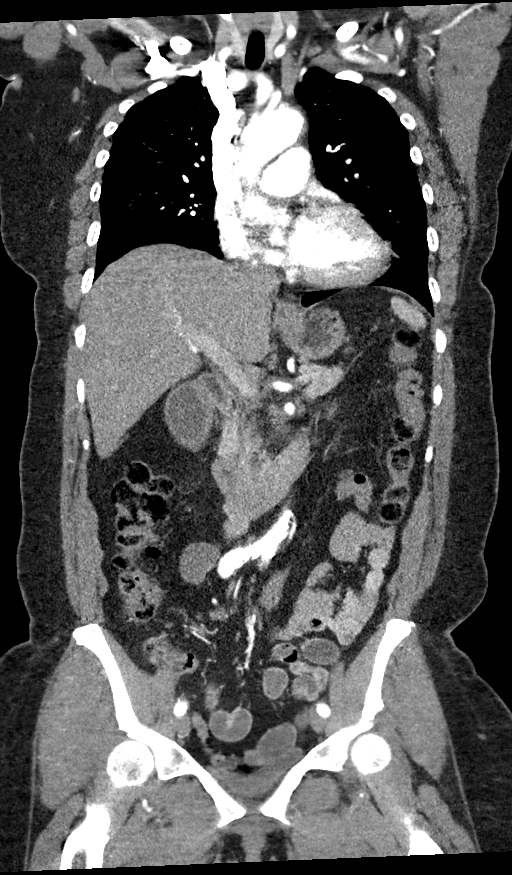
[im 115/153  soft-tissue]
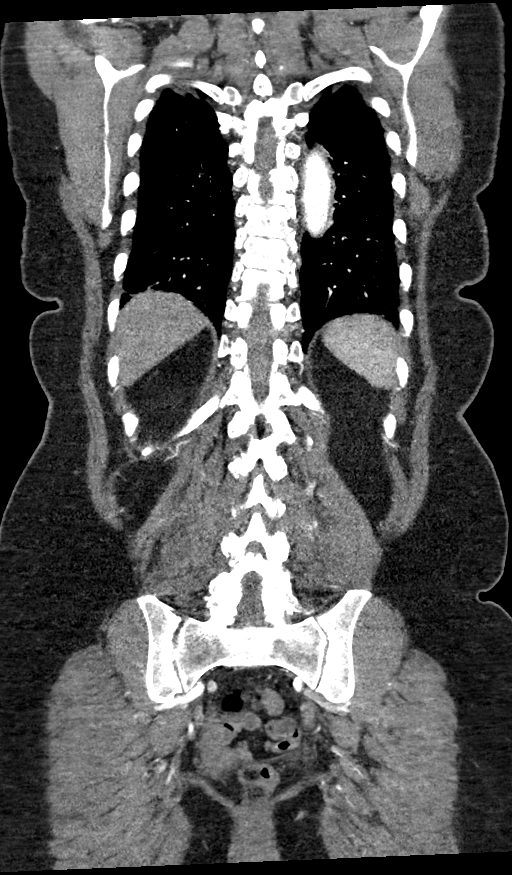

[Series 10: axial arterial · axial · arterial · 0.73mm/px · z∈[-904,-358]mm · 11 of 208 slices shown, 13 images]
[im 13/208  soft-tissue]
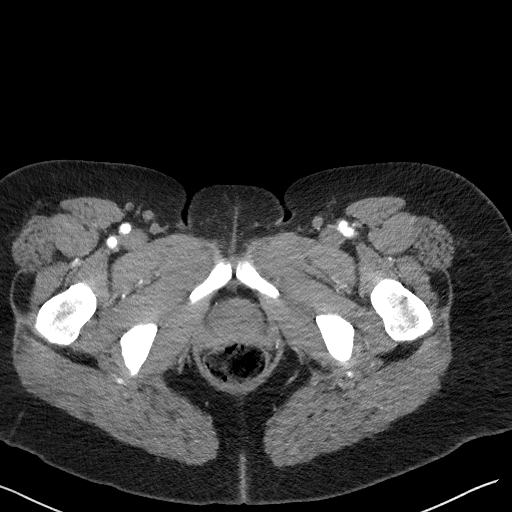
[im 13/208  bone]
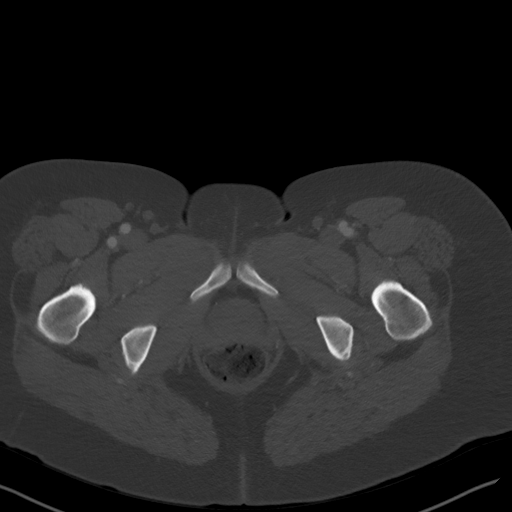
[im 39/208  soft-tissue]
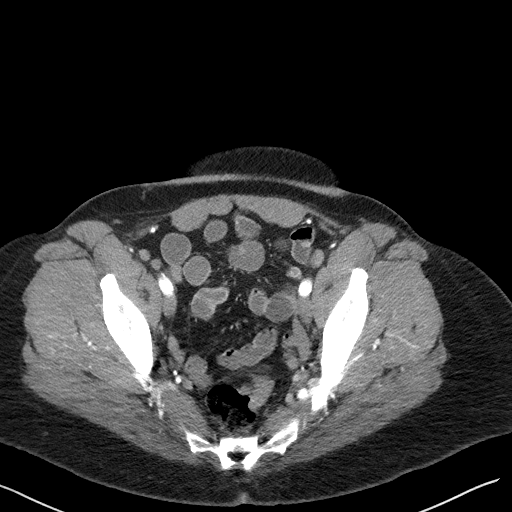
[im 52/208  soft-tissue]
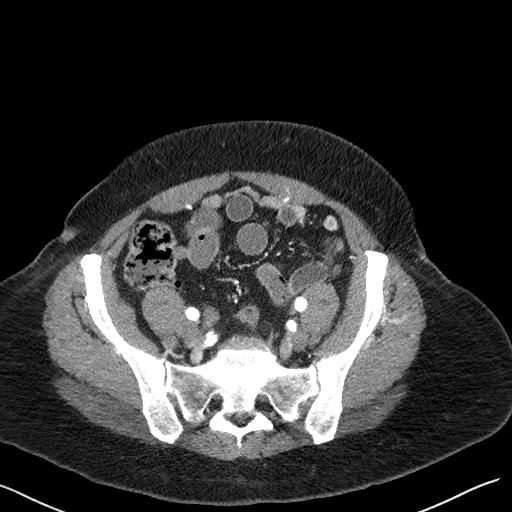
[im 65/208  soft-tissue]
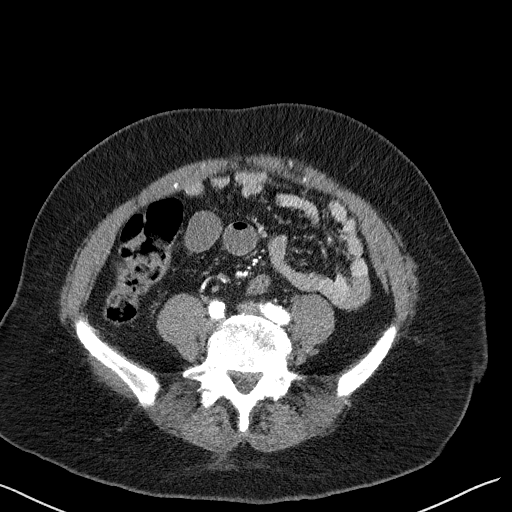
[im 91/208  soft-tissue]
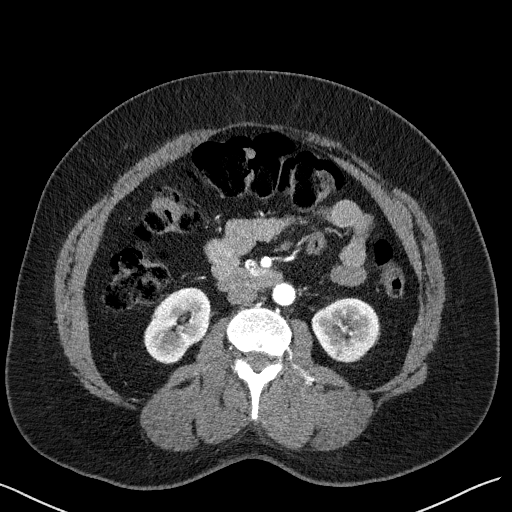
[im 104/208  soft-tissue]
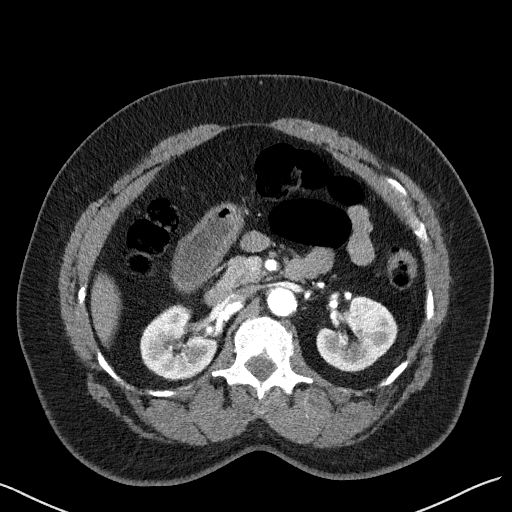
[im 117/208  soft-tissue]
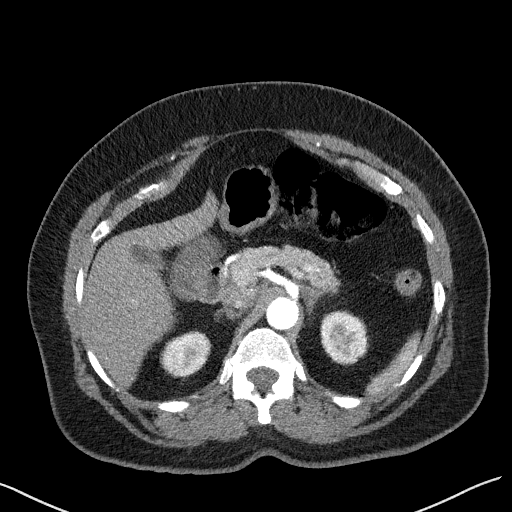
[im 143/208  soft-tissue]
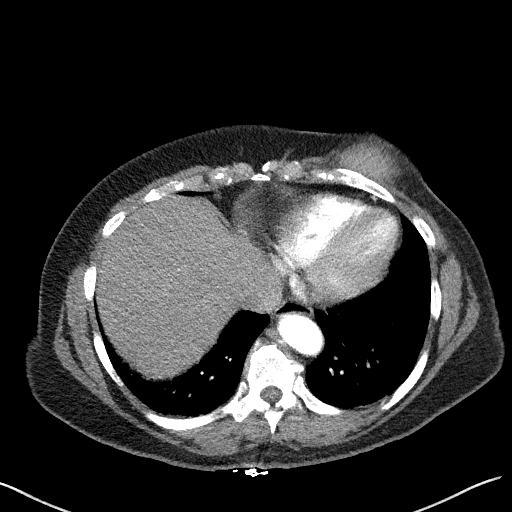
[im 156/208  soft-tissue]
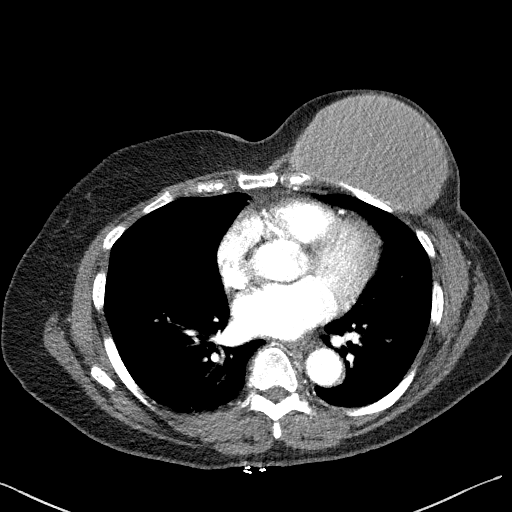
[im 156/208  bone]
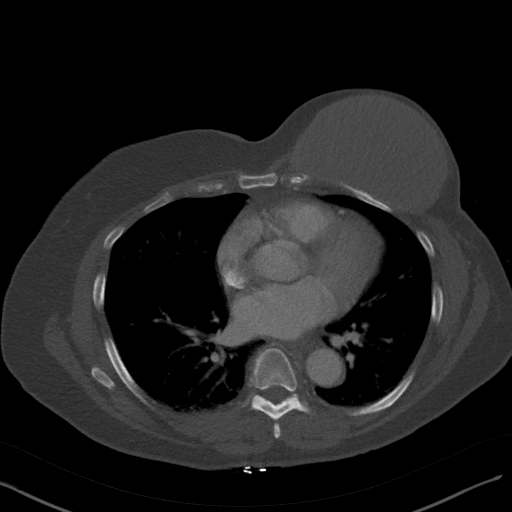
[im 169/208  soft-tissue]
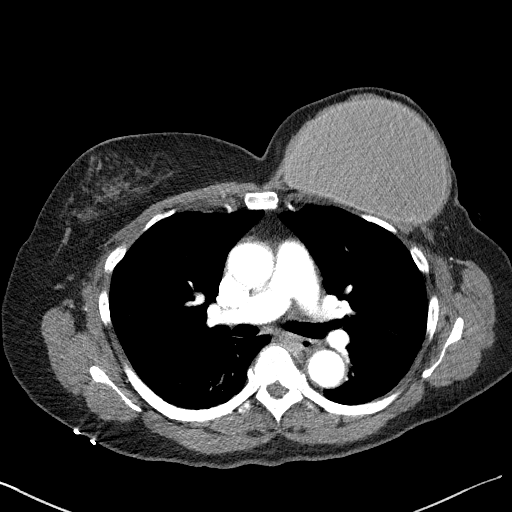
[im 195/208  soft-tissue]
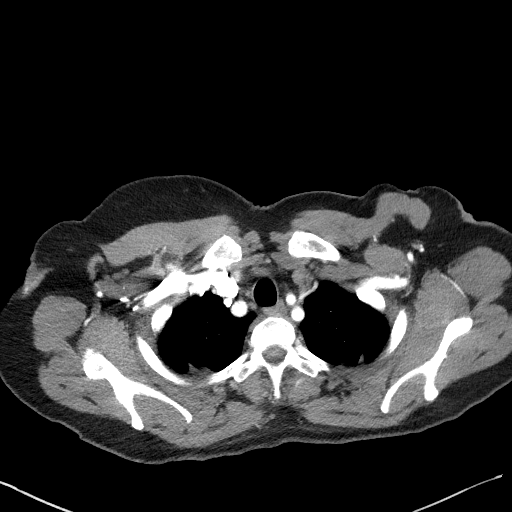

[14 of 46 positions shown; findings below may reference images not displayed]

Multidetector CT imaging through the chest, abdomen and pelvis was
performed using the standard protocol during bolus administration of
intravenous contrast. Multiplanar reconstructed images and MIPs were
obtained and reviewed to evaluate the vascular anatomy.

CONTRAST:  100mL OMNIPAQUE IOHEXOL 350 MG/ML SOLN
FINDINGS: CTA CHEST FINDINGS

Cardiovascular: Non contrasted images of the chest demonstrate no
acute intramural hematoma. Moderate aortic atherosclerosis. Negative
for aneurysm or dissection.

Mediastinum/Nodes: No enlarged mediastinal, hilar, or axillary lymph
nodes. Thyroid gland, trachea, and esophagus demonstrate no
significant findings.

Lungs/Pleura: Lungs are clear. No pleural effusion or pneumothorax.

Musculoskeletal: No chest wall abnormality. No acute or significant
osseous findings. Left breast prosthesis.

Review of the MIP images confirms the above findings.

CTA ABDOMEN AND PELVIS FINDINGS

VASCULAR

Aorta: Normal caliber aorta without aneurysm, dissection, vasculitis
or significant stenosis.

Celiac: Patent without evidence of aneurysm, dissection, vasculitis
or significant stenosis.

SMA: Patent without evidence of aneurysm, dissection, vasculitis or
significant stenosis.

Renals: Single right renal artery demonstrates moderate focal
stenosis at the origin. Two left renal arteries appear patent.

IMA: Calcified at the origin.  Appears patent.

Inflow: Mild to moderate aortic atherosclerosis without high-grade
stenosis, occlusion, or dissection.

Review of the MIP images confirms the above findings.

NON-VASCULAR

Hepatobiliary: No focal liver abnormality is seen. No gallstones,
gallbladder wall thickening, or biliary dilatation.

Pancreas: Unremarkable. No pancreatic ductal dilatation or
surrounding inflammatory changes.

Spleen: Normal in size without focal abnormality.

Adrenals/Urinary Tract: Adrenal glands are unremarkable. Kidneys are
normal, without renal calculi, focal lesion, or hydronephrosis.
Bladder is unremarkable.

Stomach/Bowel: Stomach is within normal limits. Appendix appears
normal. No evidence of bowel wall thickening, distention, or
inflammatory changes.

Lymphatic: No significant adenopathy

Reproductive: Status post hysterectomy. No adnexal masses.

Other: Negative for free air or free fluid. Fat containing umbilical
and periumbilical ventral hernias.

Musculoskeletal: No acute or significant osseous findings.

Review of the MIP images confirms the above findings.
IMPRESSION: 1. Negative for aortic dissection or aortic aneurysm.
2. No CT evidence for acute intrathoracic, intra-abdominal, or
intrapelvic abnormality.
3. Suspected moderate stenosis of the right renal artery at its
origin.

## 2022-01-02 ENCOUNTER — Ambulatory Visit: Payer: Managed Care, Other (non HMO) | Admitting: Surgery

## 2022-01-14 ENCOUNTER — Ambulatory Visit: Payer: Managed Care, Other (non HMO) | Admitting: Surgery

## 2022-01-17 ENCOUNTER — Emergency Department
Admission: EM | Admit: 2022-01-17 | Discharge: 2022-01-17 | Disposition: A | Discharge status: Home / Self Care | Payer: No Typology Code available for payment source | Attending: Emergency Medicine | Admitting: Emergency Medicine

## 2022-01-17 ENCOUNTER — Ambulatory Visit: Payer: Self-pay | Admitting: Surgery

## 2022-01-17 ENCOUNTER — Ambulatory Visit: Payer: Managed Care, Other (non HMO) | Admitting: Surgery

## 2022-01-17 ENCOUNTER — Emergency Department: Payer: No Typology Code available for payment source

## 2022-01-17 ENCOUNTER — Telehealth: Payer: Self-pay | Admitting: Surgery

## 2022-01-17 ENCOUNTER — Other Ambulatory Visit: Payer: Self-pay

## 2022-01-17 ENCOUNTER — Ambulatory Visit (INDEPENDENT_AMBULATORY_CARE_PROVIDER_SITE_OTHER): Payer: No Typology Code available for payment source | Admitting: Surgery

## 2022-01-17 ENCOUNTER — Encounter: Payer: Self-pay | Admitting: Surgery

## 2022-01-17 VITALS — BP 155/105 | HR 94 | Temp 98.6°F | Ht 67.0 in | Wt 192.6 lb

## 2022-01-17 DIAGNOSIS — K439 Ventral hernia without obstruction or gangrene: Secondary | ICD-10-CM | POA: Diagnosis not present

## 2022-01-17 DIAGNOSIS — Z20822 Contact with and (suspected) exposure to covid-19: Secondary | ICD-10-CM | POA: Insufficient documentation

## 2022-01-17 DIAGNOSIS — R531 Weakness: Secondary | ICD-10-CM | POA: Diagnosis not present

## 2022-01-17 DIAGNOSIS — R112 Nausea with vomiting, unspecified: Secondary | ICD-10-CM | POA: Diagnosis present

## 2022-01-17 DIAGNOSIS — K430 Incisional hernia with obstruction, without gangrene: Secondary | ICD-10-CM

## 2022-01-17 LAB — CBC
HCT: 41.8 % (ref 36.0–46.0)
Hemoglobin: 13.7 g/dL (ref 12.0–15.0)
MCH: 28 pg (ref 26.0–34.0)
MCHC: 32.8 g/dL (ref 30.0–36.0)
MCV: 85.5 fL (ref 80.0–100.0)
Platelets: 349 10*3/uL (ref 150–400)
RBC: 4.89 MIL/uL (ref 3.87–5.11)
RDW: 15.1 % (ref 11.5–15.5)
WBC: 12.7 10*3/uL — ABNORMAL HIGH (ref 4.0–10.5)
nRBC: 0 % (ref 0.0–0.2)

## 2022-01-17 LAB — BASIC METABOLIC PANEL
Anion gap: 13 (ref 5–15)
BUN: 21 mg/dL (ref 8–23)
CO2: 23 mmol/L (ref 22–32)
Calcium: 9.8 mg/dL (ref 8.9–10.3)
Chloride: 100 mmol/L (ref 98–111)
Creatinine, Ser: 1.13 mg/dL — ABNORMAL HIGH (ref 0.44–1.00)
GFR, Estimated: 54 mL/min — ABNORMAL LOW (ref >60)
Glucose, Bld: 128 mg/dL — ABNORMAL HIGH (ref 70–99)
Potassium: 3.2 mmol/L — ABNORMAL LOW (ref 3.5–5.1)
Sodium: 136 mmol/L (ref 135–145)

## 2022-01-17 LAB — HEPATIC FUNCTION PANEL
ALT: 24 U/L (ref 0–44)
AST: 27 U/L (ref 15–41)
Albumin: 4 g/dL (ref 3.5–5.0)
Alkaline Phosphatase: 92 U/L (ref 38–126)
Bilirubin, Direct: 0.1 mg/dL (ref 0.0–0.2)
Indirect Bilirubin: 0.6 mg/dL (ref 0.3–0.9)
Total Bilirubin: 0.7 mg/dL (ref 0.3–1.2)
Total Protein: 8.3 g/dL — ABNORMAL HIGH (ref 6.5–8.1)

## 2022-01-17 LAB — RESP PANEL BY RT-PCR (FLU A&B, COVID) ARPGX2
Influenza A by PCR: NEGATIVE
Influenza B by PCR: NEGATIVE
SARS Coronavirus 2 by RT PCR: NEGATIVE

## 2022-01-17 LAB — LACTIC ACID, PLASMA: Lactic Acid, Venous: 0.8 mmol/L (ref 0.5–1.9)

## 2022-01-17 LAB — LIPASE, BLOOD: Lipase: 28 U/L (ref 11–51)

## 2022-01-17 MED ORDER — ONDANSETRON HCL 4 MG/2ML IJ SOLN
4.0000 mg | Freq: Once | INTRAMUSCULAR | Status: AC
  Administered 2022-01-17: 4 mg via INTRAVENOUS
  Filled 2022-01-17: qty 2

## 2022-01-17 MED ORDER — ONDANSETRON 8 MG PO TBDP: 8.0000 mg | ORAL_TABLET | Freq: Three times a day (TID) | ORAL | 0 refills | Status: AC | PRN

## 2022-01-17 MED ORDER — ACETAMINOPHEN 325 MG PO TABS
650.0000 mg | ORAL_TABLET | Freq: Once | ORAL | Status: AC
  Administered 2022-01-17: 650 mg via ORAL
  Filled 2022-01-17: qty 2

## 2022-01-17 MED ORDER — IOHEXOL 300 MG/ML  SOLN
100.0000 mL | Freq: Once | INTRAMUSCULAR | Status: AC | PRN
  Administered 2022-01-17: 100 mL via INTRAVENOUS

## 2022-01-17 NOTE — Telephone Encounter (Signed)
Incoming call from the patient.  At this time of call patient states that she started vomiting about 15 minutes ago, threw up twice.  Spoke with our clinical team and patient is advised to go to the emergency room.  Patient verbalized understanding.

## 2022-01-17 NOTE — ED Provider Notes (Addendum)
[facility] Provider Note    Event Date/Time   First MD Initiated Contact with Patient 01/17/22 1733     (approximate)   History   Emesis and Hernia   HPI Deborah Jackson is a 66 y.o. female with a history of umbilical hernia planning on repair who was seen in surgical clinic today for preop evaluation and during palpation of this hernia she had an episode of vomiting.  Patient states this is never happened to her before and she denies any significant pain over the site.  Patient does endorse generalized weakness over the past few days and a fever/chills     Physical Exam   Triage Vital Signs: ED Triage Vitals  Enc Vitals Group     BP 01/17/22 1628 (!) 166/98     Pulse Rate 01/17/22 1628 (!) 107     Resp 01/17/22 1628 18     Temp 01/17/22 1628 (!) 101 F (38.3 C)     Temp Source 01/17/22 1628 Oral     SpO2 01/17/22 1628 97 %     Weight --      Height --      Head Circumference --      Peak Flow --      Pain Score 01/17/22 1629 0     Pain Loc --      Pain Edu? --      Excl. in [place]? --     Most recent vital signs: Vitals:   01/17/22 1830 01/17/22 2125  BP: (!) 146/83 112/76  Pulse: 90 64  Resp: 13 14  Temp:  98.6 F (37 C)  SpO2: 97% 100%    General: Awake, oriented x4 CV:  Good peripheral perfusion.  Resp:  Normal effort.  Abd:  No distention.  Mildly tender umbilical hernia that is easily reducible Other:  Elderly African-American female laying in bed in no distress   ED Results / Procedures / Treatments   Labs (all labs ordered are listed, but only abnormal results are displayed) Labs Reviewed  CBC - Abnormal; Notable for the following components:      Result Value   WBC 12.7 (*)    All other components within normal limits  BASIC METABOLIC PANEL - Abnormal; Notable for the following components:   Potassium 3.2 (*)    Glucose, Bld 128 (*)    Creatinine, Ser 1.13 (*)    GFR, Estimated 54 (*)    All other components  within normal limits  HEPATIC FUNCTION PANEL - Abnormal; Notable for the following components:   Total Protein 8.3 (*)    All other components within normal limits  RESP PANEL BY RT-PCR (FLU A&B, COVID) ARPGX2  LIPASE, BLOOD  LACTIC ACID, PLASMA  LACTIC ACID, PLASMA   RADIOLOGY ED MD interpretation: CT of the abdomen and pelvis shows no bowel obstruction or acute finding with periumbilical ventral hernia containing mesenteric fat without any herniated bowel.  Agree with radiology assessment  Official radiology report(s): CT Abdomen Pelvis W Contrast  Result Date: 01/17/2022 CLINICAL DATA:  Umbilical hernia with pain.  Vomiting. EXAM: CT ABDOMEN AND PELVIS WITH CONTRAST TECHNIQUE: Multidetector CT imaging of the abdomen and pelvis was performed using the standard protocol following bolus administration of intravenous contrast. RADIATION DOSE REDUCTION: This exam was performed according to the departmental dose-optimization program which includes automated exposure control, adjustment of the mA and/or kV according to patient size and/or use of iterative reconstruction technique. CONTRAST:  126mL OMNIPAQUE IOHEXOL  300 MG/ML  SOLN COMPARISON:  05/01/2020 FINDINGS: Lower chest: Lung bases are clear.  No pleural or pericardial fluid. Hepatobiliary: Mild diffuse fatty change of the liver. 2 subcentimeter low densities in the right lobe of the liver which were present on the previous study and represent cysts. No calcified gallstones. Pancreas: Normal Spleen: Normal Adrenals/Urinary Tract: Adrenal glands are normal. Kidneys are normal. No cyst, mass, stone or hydronephrosis. Some renal arterial calcification is present. Bladder is normal. Stomach/Bowel: Stomach and small intestine are normal. There is a periumbilical ventral hernia with the hernia defect measuring 1.5 cm. Mesenteric fat extends through the hernia defect, measuring about 5 cm in size. There is no herniated bowel. No sign of bowel obstruction.  No visible appendix. No colon pathology. Vascular/Lymphatic: Aortic atherosclerosis. No aneurysm. IVC is normal. No adenopathy. Reproductive: Previous hysterectomy.  No pelvic mass. Other: No free fluid or air. Musculoskeletal: Chronic lumbar degenerative changes. IMPRESSION: No bowel obstruction or acute finding. Periumbilical ventral hernia with a 1.5 cm hernia defect and herniation of mesenteric fat. No herniated bowel. Mild diffuse fatty change of the liver. Electronically Signed   By: Nelson Chimes M.D.   On: 01/17/2022 18:38      PROCEDURES:  Critical Care performed: No  .1-3 Lead EKG Interpretation Performed by: Naaman Plummer, MD Authorized by: Naaman Plummer, MD     Interpretation: normal     ECG rate:  82   ECG rate assessment: normal     Rhythm: sinus rhythm     Ectopy: none     Conduction: normal     MEDICATIONS ORDERED IN ED: Medications  acetaminophen (TYLENOL) tablet 650 mg (650 mg Oral Given 01/17/22 1634)  iohexol (OMNIPAQUE) 300 MG/ML solution 100 mL (100 mLs Intravenous Contrast Given 01/17/22 1819)  ondansetron (ZOFRAN) injection 4 mg (4 mg Intravenous Given 01/17/22 2126)     IMPRESSION / MDM / [place] / ED COURSE  I reviewed the triage vital signs and the nursing notes.                              Differential diagnosis includes, but is not limited to, small bowel obstruction, incarcerated hernia, strangulated hernia, gastroenteritis, colitis, diverticulitis  *The patient is on the cardiac monitor to evaluate for evidence of arrhythmia and/or significant heart rate changes.  Patient presents for acute nausea/vomiting The cause of the patients symptoms is not clear, but the patient is overall well appearing and is suspected to have a transient course of illness.  Given History and Exam there does not appear to be an emergent cause of the symptoms such as small bowel obstruction, coronary syndrome, bowel ischemia, DKA, pancreatitis,  appendicitis, other acute abdomen or other emergent problem. Patient's laboratory evaluation and significant for: CBC WBC 12.7 BMP creatinine 1.13 likely from mild dehydration  Patient does have evidence of fever to 101 on admission and given Tylenol with defervesced since. Patient CT of the abdomen and pelvis did not show any evidence of incarcerated hernia or bowel obstruction Reassessment: After treatment, the patient is feeling much better, tolerating PO fluids, and shows no signs of dehydration.   Disposition: Discharge home with prompt primary care physician follow up in the next 48 hours. Strict return precautions discussed.       FINAL CLINICAL IMPRESSION(S) / ED DIAGNOSES   Final diagnoses:  Nausea and vomiting, unspecified vomiting type     Rx / DC Orders  ED Discharge Orders          Ordered    ondansetron (ZOFRAN-ODT) 8 MG disintegrating tablet  Every 8 hours PRN        01/17/22 2239             Note:  This document was prepared using Dragon voice recognition software and may include unintentional dictation errors.   Naaman Plummer, MD 01/17/22 2314    Naaman Plummer, MD 01/17/22 [phone]

## 2022-01-17 NOTE — ED Triage Notes (Signed)
Pt states she went to the doctor today because she is supposed to have surgery for hernia on Wednesday. The doctor pressed on the hernia and when she got home it made her vomit, reports she was told to go to ER because she is not supposed to be vomiting with hernia.  Febrile in triage.   Pt reports this will be workers comp, [employer] home [facility] in [place]. States she already did UDS for them.

## 2022-01-17 NOTE — Progress Notes (Signed)
Patient ID: Deborah Jackson, female   DOB: 08/15/1956, 66 y.o.   MRN: [id number]  Chief Complaint: Periumbilical hernia, tender with incarceration  History of Present Illness Deborah Jackson is a 66 y.o. female with acute onset of periumbilical pain while at work last November.  Subsequent work-up included a CT scan at an outside facility.  Identifying no bowel involvement of the incarcerated fatty tissue, proceeding with repair is elective.  She complains of persistent tenderness exacerbated by movement or lifting.  She denies fevers, chills, nausea, vomiting, constipation or obstipation.  She does also complain of some right flank pain that began at the same time as the hernia presented.  Prior abdominal surgeries include appendectomy and hysterectomy.  Past Medical History Past Medical History:  Diagnosis Date   Breast cancer ([place])    Hypertension       Past Surgical History:  Procedure Laterality Date   APPENDECTOMY     BREAST SURGERY     COLONOSCOPY  2013   normal   Left lumpectomy  2014   PARTIAL HYSTERECTOMY      Allergies  Allergen Reactions   Tape Rash    Current Outpatient Medications  Medication Sig Dispense Refill   allopurinol (ZYLOPRIM) 100 MG tablet Take 100 mg by mouth daily.     amLODipine (NORVASC) 10 MG tablet Take 1 tablet by mouth daily.     aspirin 81 MG chewable tablet Chew 81 mg by mouth.     ibuprofen (ADVIL,MOTRIN) 800 MG tablet Take 1 tablet (800 mg total) by mouth every 8 (eight) hours as needed. 30 tablet 3   indomethacin (INDOCIN) 50 MG capsule Take 50 mg by mouth 2 (two) times daily with a meal.     lisinopril-hydrochlorothiazide (PRINZIDE,ZESTORETIC) 20-25 MG tablet Take 1 tablet by mouth daily.     naproxen (NAPROSYN) 500 MG tablet Take 1 tablet (500 mg total) by mouth 2 (two) times daily with a meal. 60 tablet 0   pegaspargase (ONCASPAR) 750 UNIT/ML SOLN Inject into the muscle once.     No current facility-administered medications for this  visit.    Family History Family History  Problem Relation Age of Onset   Hypertension Mother    Diabetes Mother    Gout Mother    Lung cancer Father    Stomach cancer Father    Diabetes Brother    Gout Brother    Hypertension Brother    Hypertension Brother       Social History Social History   Tobacco Use   Smoking status: Never   Smokeless tobacco: Never  Substance Use Topics   Alcohol use: No    Alcohol/week: 0.0 standard drinks   Drug use: No       Review of Systems  Constitutional: Negative.   HENT: Negative.    Eyes: Negative.   Respiratory: Negative.    Cardiovascular: Negative.   Gastrointestinal: Negative.   Genitourinary: Negative.   Skin: Negative.   Neurological: Negative.   Psychiatric/Behavioral: Negative.       Physical Exam Blood pressure (!) 155/105, pulse 94, temperature 98.6 F (37 C), temperature source Oral, height 5\' 7"  (1.702 m), weight 192 lb 9.6 oz (87.4 kg), SpO2 97 %. Last Weight  Most recent update: 01/17/2022 10:12 AM    Weight  87.4 kg (192 lb 9.6 oz)             CONSTITUTIONAL: Well developed, and nourished, appropriately responsive and aware without distress.   EYES: Sclera non-icteric.  EARS, NOSE, MOUTH AND THROAT: Mask worn.     Hearing is intact to voice.  NECK: Trachea is midline, and there is no jugular venous distension.  LYMPH NODES:  Lymph nodes in the neck are not enlarged. RESPIRATORY:  Lungs are clear, and breath sounds are equal bilaterally. Normal respiratory effort without pathologic use of accessory muscles. CARDIOVASCULAR: Heart is regular in rate and rhythm. GI: The abdomen has a tender bump, cephalad to the scar at the umbilicus.  Tender to touch, so difficult to have adequate fascial defect guestimate.  Otherwise soft, nontender, and nondistended. There were no palpable masses. I did not appreciate hepatosplenomegaly. There were normal bowel sounds. MUSCULOSKELETAL:  Symmetrical muscle tone appreciated  in all four extremities.    SKIN: Skin turgor is normal. No pathologic skin lesions appreciated.  NEUROLOGIC:  Motor and sensation appear grossly normal.  Cranial nerves are grossly without defect. PSYCH:  Alert and oriented to person, place and time. Affect is appropriate for situation.  Data Reviewed I have personally reviewed what is currently available of the patient's imaging, recent labs and medical records.   Labs:  CBC Latest Ref Rng & Units 05/01/2020  WBC 4.0 - 10.5 K/uL 6.6  Hemoglobin 12.0 - 15.0 g/dL 14.1  Hematocrit 36.0 - 46.0 % 42.3  Platelets 150 - 400 K/uL 515(H)   CMP Latest Ref Rng & Units 05/01/2020  Glucose 70 - 99 mg/dL 107(H)  BUN 8 - 23 mg/dL 25(H)  Creatinine 0.44 - 1.00 mg/dL 0.91  Sodium 135 - 145 mmol/L 140  Potassium 3.5 - 5.1 mmol/L 3.6  Chloride 98 - 111 mmol/L 104  CO2 22 - 32 mmol/L 26  Calcium 8.9 - 10.3 mg/dL 9.7  Total Protein 6.5 - 8.1 g/dL 8.1  Total Bilirubin 0.3 - 1.2 mg/dL 0.6  Alkaline Phos 38 - 126 U/L 106  AST 15 - 41 U/L 23  ALT 0 - 44 U/L 21      Imaging: Radiology review:   The report from [facility] mentions a normal appendix and a mild/moderate fat-containing umbilical hernia without surrounding inflammatory changes.  Diastases recti.  This was from December 09, 2021.  Within last 24 hrs: No results found.  Assessment    Incarcerated AA wall likely incisional hernia, tender, estimated at 4 cm.  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 I discussed possibility of  incarceration, strangulation, enlargement in size over time, and the need for emergency surgery in the face of these.  Also reviewed the techniques of reduction should incarceration occur, and when unsuccessful to present to the ED.  Also discussed that surgery risks include recurrence which can be up to 30% in the case of complex hernias, use of prosthetic materials (mesh) and the increased risk of infection and the possible need for re-operation and removal of mesh, possibility of post-op SBO or ileus, and the risks of general anesthetic including heart attack, stroke, sudden death or some reaction to anesthetic medications. The patient, and those present, appear to understand the risks, any and all questions were answered to  the patient's satisfaction.  No guarantees were ever expressed or implied.   Face-to-face time spent with the patient and accompanying care providers(if present) was 48 minutes, with more than 50% of the time spent counseling, educating, and coordinating care of the patient.    These notes generated with voice recognition software. I apologize for typographical errors.  Ronny Bacon M.D., FACS 01/17/2022, 12:25 PM

## 2022-01-17 NOTE — Telephone Encounter (Signed)
Patient has been advised of Pre-Admission date/time, COVID Testing date and Surgery date.  Surgery Date: 01/23/22 Preadmission Testing Date: 01/18/22 (phone 7:30 am-1pm) Covid Testing Date: Not needed.   Patient has been made aware to call [phone], between 1-3:00pm the day before surgery, to find out what time to arrive for surgery.

## 2022-01-17 NOTE — ED Notes (Signed)
Pt verbalized understanding of discharge instructions, prescriptions, and follow-up care instructions. Pt advised if symptoms worsen to return to ED.  

## 2022-01-17 NOTE — Patient Instructions (Addendum)
Our surgical scheduler will call you within 24-48 hours to schedule your surgery. Please have the blue surgery sheet available when speaking with her.  Umbilical Hernia, Adult A hernia is a bulge of tissue that pushes through an opening between muscles. An umbilical hernia happens in the abdomen, near the belly button (umbilicus). The hernia may contain tissues from the small intestine, large intestine, or fatty tissue covering the intestines. Umbilical hernias in adults tend to get worse over time, and they require surgical treatment. There are different types of umbilical hernias, including: Indirect hernia. This type is located just above or below the umbilicus. It is the most common type of umbilical hernia in adults. Direct hernia. This type forms through an opening formed by the umbilicus. Reducible hernia. This type of hernia comes and goes. It may be visible only when you strain, lift something heavy, or cough. This type of hernia can be pushed back into the abdomen (reduced). Incarcerated hernia. This type traps abdominal tissue inside the hernia. This type of hernia cannot be reduced. Strangulated hernia. This type of hernia cuts off blood flow to the tissues inside the hernia. The tissues can start to die if this happens. This type of hernia requires emergency treatment. What are the causes? An umbilical hernia happens when tissue inside the abdomen presses on a weak area of the abdominal muscles. What increases the risk? You may have a greater risk of this condition if you: Are obese. Have had several pregnancies. Have a buildup of fluid inside your abdomen. Have had surgery that weakens the abdominal muscles. What are the signs or symptoms? The main symptom of this condition is a painless bulge at or near the belly button. A reducible hernia may be visible only when you strain, lift something heavy, or cough. Other symptoms may include: Dull pain. A feeling of pressure. Symptoms of  a strangulated hernia may include: Pain that gets increasingly worse. Nausea and vomiting. Pain when pressing on the hernia. Skin over the hernia becoming red or purple. Constipation. Blood in the stool. How is this diagnosed? This condition may be diagnosed based on: A physical exam. You may be asked to cough or strain while standing. These actions increase the pressure inside your abdomen and can force the hernia through the opening in your muscles. Your health care provider may try to reduce the hernia by pressing on it. Your symptoms and medical history. How is this treated? Surgery is the only treatment for an umbilical hernia. Surgery for a strangulated hernia is done as soon as possible. If you have a small hernia that is not incarcerated, you may need to lose weight before having surgery. Follow these instructions at home: Lose weight, if told by your health care provider. Do not try to push the hernia back in. Watch your hernia for any changes in color or size. Tell your health care provider if any changes occur. You may need to avoid activities that increase pressure on your hernia. Do not lift anything that is heavier than 10 lb (4.5 kg), or the limit that you are told, until your health care provider says that it is safe. Take over-the-counter and prescription medicines only as told by your health care provider. Keep all follow-up visits. This is important. Contact a health care provider if: Your hernia gets larger. Your hernia becomes painful. Get help right away if: You develop sudden, severe pain near the area of your hernia. You have pain as well as nausea or vomiting.  You have pain and the skin over your hernia changes color. You develop a fever or chills. Summary A hernia is a bulge of tissue that pushes through an opening between muscles. An umbilical hernia happens near the belly button. Surgery is the only treatment for an umbilical hernia. Do not try to push your  hernia back in. Keep all follow-up visits. This is important. This information is not intended to replace advice given to you by your health care provider. Make sure you discuss any questions you have with your health care provider. Document Revised: 07/17/2020 Document Reviewed: 07/17/2020 Elsevier Patient Education  [place].

## 2022-01-17 NOTE — H&P (View-Only) (Signed)
Patient ID: Deborah Jackson, female   DOB: 05/27/1956, 66 y.o.   MRN: [id number]  Chief Complaint: Periumbilical hernia, tender with incarceration  History of Present Illness Deborah Jackson is a 66 y.o. female with acute onset of periumbilical pain while at work last November.  Subsequent work-up included a CT scan at an outside facility.  Identifying no bowel involvement of the incarcerated fatty tissue, proceeding with repair is elective.  She complains of persistent tenderness exacerbated by movement or lifting.  She denies fevers, chills, nausea, vomiting, constipation or obstipation.  She does also complain of some right flank pain that began at the same time as the hernia presented.  Prior abdominal surgeries include appendectomy and hysterectomy.  Past Medical History Past Medical History:  Diagnosis Date   Breast cancer ([place])    Hypertension       Past Surgical History:  Procedure Laterality Date   APPENDECTOMY     BREAST SURGERY     COLONOSCOPY  2013   normal   Left lumpectomy  2014   PARTIAL HYSTERECTOMY      Allergies  Allergen Reactions   Tape Rash    Current Outpatient Medications  Medication Sig Dispense Refill   allopurinol (ZYLOPRIM) 100 MG tablet Take 100 mg by mouth daily.     amLODipine (NORVASC) 10 MG tablet Take 1 tablet by mouth daily.     aspirin 81 MG chewable tablet Chew 81 mg by mouth.     ibuprofen (ADVIL,MOTRIN) 800 MG tablet Take 1 tablet (800 mg total) by mouth every 8 (eight) hours as needed. 30 tablet 3   indomethacin (INDOCIN) 50 MG capsule Take 50 mg by mouth 2 (two) times daily with a meal.     lisinopril-hydrochlorothiazide (PRINZIDE,ZESTORETIC) 20-25 MG tablet Take 1 tablet by mouth daily.     naproxen (NAPROSYN) 500 MG tablet Take 1 tablet (500 mg total) by mouth 2 (two) times daily with a meal. 60 tablet 0   pegaspargase (ONCASPAR) 750 UNIT/ML SOLN Inject into the muscle once.     No current facility-administered medications for this  visit.    Family History Family History  Problem Relation Age of Onset   Hypertension Mother    Diabetes Mother    Gout Mother    Lung cancer Father    Stomach cancer Father    Diabetes Brother    Gout Brother    Hypertension Brother    Hypertension Brother       Social History Social History   Tobacco Use   Smoking status: Never   Smokeless tobacco: Never  Substance Use Topics   Alcohol use: No    Alcohol/week: 0.0 standard drinks   Drug use: No       Review of Systems  Constitutional: Negative.   HENT: Negative.    Eyes: Negative.   Respiratory: Negative.    Cardiovascular: Negative.   Gastrointestinal: Negative.   Genitourinary: Negative.   Skin: Negative.   Neurological: Negative.   Psychiatric/Behavioral: Negative.       Physical Exam Blood pressure (!) 155/105, pulse 94, temperature 98.6 F (37 C), temperature source Oral, height 5\' 7"  (1.702 m), weight 192 lb 9.6 oz (87.4 kg), SpO2 97 %. Last Weight  Most recent update: 01/17/2022 10:12 AM    Weight  87.4 kg (192 lb 9.6 oz)             CONSTITUTIONAL: Well developed, and nourished, appropriately responsive and aware without distress.   EYES: Sclera non-icteric.  EARS, NOSE, MOUTH AND THROAT: Mask worn.     Hearing is intact to voice.  NECK: Trachea is midline, and there is no jugular venous distension.  LYMPH NODES:  Lymph nodes in the neck are not enlarged. RESPIRATORY:  Lungs are clear, and breath sounds are equal bilaterally. Normal respiratory effort without pathologic use of accessory muscles. CARDIOVASCULAR: Heart is regular in rate and rhythm. GI: The abdomen has a tender bump, cephalad to the scar at the umbilicus.  Tender to touch, so difficult to have adequate fascial defect guestimate.  Otherwise soft, nontender, and nondistended. There were no palpable masses. I did not appreciate hepatosplenomegaly. There were normal bowel sounds. MUSCULOSKELETAL:  Symmetrical muscle tone appreciated  in all four extremities.    SKIN: Skin turgor is normal. No pathologic skin lesions appreciated.  NEUROLOGIC:  Motor and sensation appear grossly normal.  Cranial nerves are grossly without defect. PSYCH:  Alert and oriented to person, place and time. Affect is appropriate for situation.  Data Reviewed I have personally reviewed what is currently available of the patient's imaging, recent labs and medical records.   Labs:  CBC Latest Ref Rng & Units 05/01/2020  WBC 4.0 - 10.5 K/uL 6.6  Hemoglobin 12.0 - 15.0 g/dL 14.1  Hematocrit 36.0 - 46.0 % 42.3  Platelets 150 - 400 K/uL 515(H)   CMP Latest Ref Rng & Units 05/01/2020  Glucose 70 - 99 mg/dL 107(H)  BUN 8 - 23 mg/dL 25(H)  Creatinine 0.44 - 1.00 mg/dL 0.91  Sodium 135 - 145 mmol/L 140  Potassium 3.5 - 5.1 mmol/L 3.6  Chloride 98 - 111 mmol/L 104  CO2 22 - 32 mmol/L 26  Calcium 8.9 - 10.3 mg/dL 9.7  Total Protein 6.5 - 8.1 g/dL 8.1  Total Bilirubin 0.3 - 1.2 mg/dL 0.6  Alkaline Phos 38 - 126 U/L 106  AST 15 - 41 U/L 23  ALT 0 - 44 U/L 21      Imaging: Radiology review:   The report from [facility] mentions a normal appendix and a mild/moderate fat-containing umbilical hernia without surrounding inflammatory changes.  Diastases recti.  This was from December 09, 2021.  Within last 24 hrs: No results found.  Assessment    Incarcerated AA wall likely incisional hernia, tender, estimated at 4 cm.  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 I discussed possibility of  incarceration, strangulation, enlargement in size over time, and the need for emergency surgery in the face of these.  Also reviewed the techniques of reduction should incarceration occur, and when unsuccessful to present to the ED.  Also discussed that surgery risks include recurrence which can be up to 30% in the case of complex hernias, use of prosthetic materials (mesh) and the increased risk of infection and the possible need for re-operation and removal of mesh, possibility of post-op SBO or ileus, and the risks of general anesthetic including heart attack, stroke, sudden death or some reaction to anesthetic medications. The patient, and those present, appear to understand the risks, any and all questions were answered to  the patient's satisfaction.  No guarantees were ever expressed or implied.   Face-to-face time spent with the patient and accompanying care providers(if present) was 48 minutes, with more than 50% of the time spent counseling, educating, and coordinating care of the patient.    These notes generated with voice recognition software. I apologize for typographical errors.  Ronny Bacon M.D., FACS 01/17/2022, 12:25 PM

## 2022-01-18 ENCOUNTER — Encounter
Admission: RE | Admit: 2022-01-18 | Discharge: 2022-01-18 | Disposition: A | Discharge status: Home / Self Care | Payer: Managed Care, Other (non HMO) | Source: Ambulatory Visit | Attending: Surgery | Admitting: Surgery

## 2022-01-18 ENCOUNTER — Other Ambulatory Visit: Payer: Self-pay

## 2022-01-18 HISTORY — DX: Other specified postprocedural states: Z98.890

## 2022-01-18 HISTORY — DX: Nausea with vomiting, unspecified: R11.2

## 2022-01-18 NOTE — Patient Instructions (Signed)
Your procedure is scheduled on: 01/23/22 Report to [facility]. To find out your arrival time please call [phone] between 1PM - 3PM on 01/22/22.  Remember: Instructions that are not followed completely may result in serious medical risk, up to and including death, or upon the discretion of your surgeon and anesthesiologist your surgery may need to be rescheduled.     _X__ 1. Do not eat food or drink any liquids after midnight the night before your procedure.                 No gum chewing or hard candies.   __X__2.  On the morning of surgery brush your teeth with toothpaste and water, you                 may rinse your mouth with mouthwash if you wish.  Do not swallow any              toothpaste of mouthwash.     _X__ 3.  No Alcohol for 24 hours before or after surgery.   _X__ 4.  Do Not Smoke or use e-cigarettes For 24 Hours Prior to Your Surgery.                 Do not use any chewable tobacco products for at least 6 hours prior to                 surgery.  ____  5.  Bring all medications with you on the day of surgery if instructed.   __X__  6.  Notify your doctor if there is any change in your medical condition      (cold, fever, infections).     Do not wear jewelry, make-up, hairpins, clips or nail polish. Do not wear lotions, powders, or perfumes.  Do not shave body hair 48 hours prior to surgery. Men may shave face and neck. Do not bring valuables to the hospital.    [facility] is not responsible for any belongings or valuables.  Contacts, dentures/partials or body piercings may not be worn into surgery. Bring a case for your contacts, glasses or hearing aids, a denture cup will be supplied. Leave your suitcase in the car. After surgery it may be brought to your room. For patients admitted to the hospital, discharge time is determined by your treatment team.   Patients discharged the day of surgery will not be allowed  to drive home.   Please read over the following fact sheets that you were given:   CHG soap  __X__ Take these medicines the morning of surgery with A SIP OF WATER:    1. allopurinol (ZYLOPRIM) 100 MG tablet  2. amLODipine (NORVASC) 10 MG tablet  3.   4.  5.  6.  ____ Fleet Enema (as directed)   __X__ Use CHG Soap/SAGE wipes as directed  ____ Use inhalers on the day of surgery  ____ Stop metformin/Janumet/Farxiga 2 days prior to surgery    ____ Take 1/2 of usual insulin dose the night before surgery. No insulin the morning          of surgery.   ____ Stop Blood Thinners Coumadin/Plavix/Xarelto/Pleta/Pradaxa/Eliquis/Effient/Aspirin  on   Or contact your Surgeon, Cardiologist or Medical Doctor regarding  ability to stop your blood thinners  __X__ Stop Anti-inflammatories 7 days before surgery such as Advil, Ibuprofen, Motrin,  BC or Goodies Powder, Naprosyn, Naproxen, Aleve, Aspirin  __X__ Stop all herbal supplements, fish oil or vitamin E until after surgery.    ____ Bring C-Pap to the hospital.

## 2022-01-21 ENCOUNTER — Telehealth: Payer: Self-pay

## 2022-01-21 ENCOUNTER — Other Ambulatory Visit: Payer: Managed Care, Other (non HMO)

## 2022-01-21 ENCOUNTER — Encounter
Admission: RE | Admit: 2022-01-21 | Discharge: 2022-01-21 | Disposition: A | Discharge status: Home / Self Care | Payer: No Typology Code available for payment source | Source: Ambulatory Visit | Attending: Surgery | Admitting: Surgery

## 2022-01-21 ENCOUNTER — Other Ambulatory Visit: Payer: Self-pay

## 2022-01-21 DIAGNOSIS — Z01810 Encounter for preprocedural cardiovascular examination: Secondary | ICD-10-CM | POA: Diagnosis not present

## 2022-01-21 DIAGNOSIS — I10 Essential (primary) hypertension: Secondary | ICD-10-CM | POA: Diagnosis not present

## 2022-01-21 NOTE — Telephone Encounter (Signed)
Patient came into office -was seen in ED has sore throat and congestion. Patient scheduled for Hernia surgery 01/23/2022. Will need to postpone until better. Patient had fever 101 for several days.

## 2022-01-23 ENCOUNTER — Telehealth: Payer: Self-pay | Admitting: Surgery

## 2022-01-23 NOTE — Telephone Encounter (Signed)
Updated information regarding rescheduled surgery.    Patient has been advised of Pre-Admission date/time, COVID Testing date and Surgery date.  Surgery Date: 02/01/22 Preadmission Testing Date: 01/18/22 (phone already done Covid Testing Date: Not needed.     Patient has been made aware to call [phone], between 1-3:00pm the day before surgery, to find out what time to arrive for surgery.

## 2022-02-01 ENCOUNTER — Ambulatory Visit: Payer: No Typology Code available for payment source | Admitting: Anesthesiology

## 2022-02-01 ENCOUNTER — Other Ambulatory Visit: Payer: Self-pay

## 2022-02-01 ENCOUNTER — Encounter
Admission: RE | Disposition: A | Discharge status: Home / Self Care | Payer: Self-pay | Source: Home / Self Care | Attending: Surgery

## 2022-02-01 ENCOUNTER — Encounter: Payer: Self-pay | Admitting: Surgery

## 2022-02-01 ENCOUNTER — Ambulatory Visit
Admission: RE | Admit: 2022-02-01 | Discharge: 2022-02-01 | Disposition: A | Discharge status: Home / Self Care | Payer: No Typology Code available for payment source | Attending: Surgery | Admitting: Surgery

## 2022-02-01 DIAGNOSIS — Z9049 Acquired absence of other specified parts of digestive tract: Secondary | ICD-10-CM | POA: Insufficient documentation

## 2022-02-01 DIAGNOSIS — K436 Other and unspecified ventral hernia with obstruction, without gangrene: Secondary | ICD-10-CM | POA: Insufficient documentation

## 2022-02-01 DIAGNOSIS — Z90710 Acquired absence of both cervix and uterus: Secondary | ICD-10-CM | POA: Insufficient documentation

## 2022-02-01 DIAGNOSIS — K430 Incisional hernia with obstruction, without gangrene: Secondary | ICD-10-CM | POA: Diagnosis not present

## 2022-02-01 DIAGNOSIS — I10 Essential (primary) hypertension: Secondary | ICD-10-CM | POA: Insufficient documentation

## 2022-02-01 HISTORY — PX: INSERTION OF MESH: SHX5868

## 2022-02-01 HISTORY — PX: XI ROBOTIC ASSISTED VENTRAL HERNIA: SHX6789

## 2022-02-01 SURGERY — REPAIR, HERNIA, VENTRAL, ROBOT-ASSISTED
Anesthesia: General | Site: Abdomen

## 2022-02-01 MED ORDER — LIDOCAINE HCL (CARDIAC) PF 100 MG/5ML IV SOSY
PREFILLED_SYRINGE | INTRAVENOUS | Status: DC | PRN
  Administered 2022-02-01: 80 mg via INTRAVENOUS

## 2022-02-01 MED ORDER — ROCURONIUM BROMIDE 100 MG/10ML IV SOLN
INTRAVENOUS | Status: DC | PRN
  Administered 2022-02-01: 40 mg via INTRAVENOUS

## 2022-02-01 MED ORDER — PROPOFOL 10 MG/ML IV BOLUS
INTRAVENOUS | Status: AC
  Filled 2022-02-01: qty 20

## 2022-02-01 MED ORDER — ONDANSETRON HCL 4 MG/2ML IJ SOLN: 4.0000 mg | Freq: Once | INTRAMUSCULAR | Status: DC | PRN

## 2022-02-01 MED ORDER — ONDANSETRON HCL 4 MG/2ML IJ SOLN
INTRAMUSCULAR | Status: DC | PRN
Start: 2022-02-01 — End: 2022-02-01
  Administered 2022-02-01: 4 mg via INTRAVENOUS

## 2022-02-01 MED ORDER — GABAPENTIN 300 MG PO CAPS: 300.0000 mg | ORAL_CAPSULE | ORAL | Status: AC

## 2022-02-01 MED ORDER — CHLORHEXIDINE GLUCONATE 0.12 % MT SOLN: 15.0000 mL | Freq: Once | OROMUCOSAL | Status: AC

## 2022-02-01 MED ORDER — BUPIVACAINE LIPOSOME 1.3 % IJ SUSP: 20.0000 mL | Freq: Once | INTRAMUSCULAR | Status: DC

## 2022-02-01 MED ORDER — CEFAZOLIN SODIUM-DEXTROSE 2-4 GM/100ML-% IV SOLN
2.0000 g | INTRAVENOUS | Status: AC
  Administered 2022-02-01: 2 g via INTRAVENOUS

## 2022-02-01 MED ORDER — CHLORHEXIDINE GLUCONATE CLOTH 2 % EX PADS
6.0000 | MEDICATED_PAD | Freq: Once | CUTANEOUS | Status: AC
  Administered 2022-02-01: 6 via TOPICAL

## 2022-02-01 MED ORDER — FAMOTIDINE 20 MG PO TABS
ORAL_TABLET | ORAL | Status: AC
  Administered 2022-02-01: 20 mg via ORAL
  Filled 2022-02-01: qty 1

## 2022-02-01 MED ORDER — EPHEDRINE SULFATE (PRESSORS) 50 MG/ML IJ SOLN
INTRAMUSCULAR | Status: DC | PRN
  Administered 2022-02-01: 5 mg via INTRAVENOUS

## 2022-02-01 MED ORDER — DEXAMETHASONE SODIUM PHOSPHATE 10 MG/ML IJ SOLN
INTRAMUSCULAR | Status: DC | PRN
  Administered 2022-02-01: 10 mg via INTRAVENOUS

## 2022-02-01 MED ORDER — ACETAMINOPHEN 500 MG PO TABS: 1000.0000 mg | ORAL_TABLET | ORAL | Status: AC

## 2022-02-01 MED ORDER — ROCURONIUM BROMIDE 10 MG/ML (PF) SYRINGE
PREFILLED_SYRINGE | INTRAVENOUS | Status: AC
  Filled 2022-02-01: qty 10

## 2022-02-01 MED ORDER — MIDAZOLAM HCL 2 MG/2ML IJ SOLN
INTRAMUSCULAR | Status: AC
  Filled 2022-02-01: qty 2

## 2022-02-01 MED ORDER — CEFAZOLIN SODIUM-DEXTROSE 2-4 GM/100ML-% IV SOLN
INTRAVENOUS | Status: AC
  Filled 2022-02-01: qty 100

## 2022-02-01 MED ORDER — FENTANYL CITRATE (PF) 100 MCG/2ML IJ SOLN
INTRAMUSCULAR | Status: AC
  Administered 2022-02-01: 25 ug via INTRAVENOUS
  Filled 2022-02-01: qty 2

## 2022-02-01 MED ORDER — GABAPENTIN 300 MG PO CAPS
ORAL_CAPSULE | ORAL | Status: AC
  Administered 2022-02-01: 300 mg via ORAL
  Filled 2022-02-01: qty 1

## 2022-02-01 MED ORDER — FENTANYL CITRATE (PF) 100 MCG/2ML IJ SOLN
INTRAMUSCULAR | Status: AC
  Filled 2022-02-01: qty 2

## 2022-02-01 MED ORDER — ONDANSETRON HCL 4 MG/2ML IJ SOLN
INTRAMUSCULAR | Status: AC
  Filled 2022-02-01: qty 2

## 2022-02-01 MED ORDER — BUPIVACAINE-EPINEPHRINE 0.25% -1:200000 IJ SOLN
INTRAMUSCULAR | Status: DC | PRN
  Administered 2022-02-01: 50 mL

## 2022-02-01 MED ORDER — EPHEDRINE 5 MG/ML INJ
INTRAVENOUS | Status: AC
  Filled 2022-02-01: qty 5

## 2022-02-01 MED ORDER — CHLORHEXIDINE GLUCONATE 0.12 % MT SOLN
OROMUCOSAL | Status: AC
  Administered 2022-02-01: 15 mL via OROMUCOSAL
  Filled 2022-02-01: qty 15

## 2022-02-01 MED ORDER — LACTATED RINGERS IV SOLN: INTRAVENOUS | Status: DC

## 2022-02-01 MED ORDER — ORAL CARE MOUTH RINSE: 15.0000 mL | Freq: Once | OROMUCOSAL | Status: AC

## 2022-02-01 MED ORDER — SUGAMMADEX SODIUM 200 MG/2ML IV SOLN
INTRAVENOUS | Status: DC | PRN
  Administered 2022-02-01: 200 mg via INTRAVENOUS

## 2022-02-01 MED ORDER — DEXAMETHASONE SODIUM PHOSPHATE 10 MG/ML IJ SOLN
INTRAMUSCULAR | Status: AC
  Filled 2022-02-01: qty 1

## 2022-02-01 MED ORDER — HYDROCODONE-ACETAMINOPHEN 5-325 MG PO TABS: 1.0000 | ORAL_TABLET | Freq: Four times a day (QID) | ORAL | 0 refills | Status: DC | PRN

## 2022-02-01 MED ORDER — FENTANYL CITRATE (PF) 100 MCG/2ML IJ SOLN
25.0000 ug | INTRAMUSCULAR | Status: DC | PRN
  Administered 2022-02-01: 25 ug via INTRAVENOUS

## 2022-02-01 MED ORDER — FAMOTIDINE 20 MG PO TABS
20.0000 mg | ORAL_TABLET | Freq: Once | ORAL | Status: AC
Start: 2022-02-01 — End: 2022-02-01

## 2022-02-01 MED ORDER — LIDOCAINE HCL (PF) 2 % IJ SOLN
INTRAMUSCULAR | Status: AC
  Filled 2022-02-01: qty 5

## 2022-02-01 MED ORDER — APREPITANT 40 MG PO CAPS
ORAL_CAPSULE | ORAL | Status: AC
  Administered 2022-02-01: 40 mg via ORAL
  Filled 2022-02-01: qty 1

## 2022-02-01 MED ORDER — DEXMEDETOMIDINE (PRECEDEX) IN NS 20 MCG/5ML (4 MCG/ML) IV SYRINGE
PREFILLED_SYRINGE | INTRAVENOUS | Status: DC | PRN
  Administered 2022-02-01 (×2): 10 ug via INTRAVENOUS

## 2022-02-01 MED ORDER — PROPOFOL 10 MG/ML IV BOLUS
INTRAVENOUS | Status: DC | PRN
  Administered 2022-02-01: 25 ug/kg/min via INTRAVENOUS
  Administered 2022-02-01: 170 mg via INTRAVENOUS

## 2022-02-01 MED ORDER — APREPITANT 40 MG PO CAPS: 40.0000 mg | ORAL_CAPSULE | Freq: Once | ORAL | Status: AC

## 2022-02-01 MED ORDER — ACETAMINOPHEN 500 MG PO TABS
ORAL_TABLET | ORAL | Status: AC
  Administered 2022-02-01: 1000 mg via ORAL
  Filled 2022-02-01: qty 2

## 2022-02-01 MED ORDER — 0.9 % SODIUM CHLORIDE (POUR BTL) OPTIME
TOPICAL | Status: DC | PRN
  Administered 2022-02-01: 500 mL

## 2022-02-01 MED ORDER — SUCCINYLCHOLINE CHLORIDE 200 MG/10ML IV SOSY
PREFILLED_SYRINGE | INTRAVENOUS | Status: DC | PRN
  Administered 2022-02-01: 10 mg via INTRAVENOUS

## 2022-02-01 MED ORDER — FENTANYL CITRATE (PF) 100 MCG/2ML IJ SOLN
INTRAMUSCULAR | Status: DC | PRN
  Administered 2022-02-01: 25 ug via INTRAVENOUS
  Administered 2022-02-01: 50 ug via INTRAVENOUS
  Administered 2022-02-01: 25 ug via INTRAVENOUS

## 2022-02-01 MED ORDER — MIDAZOLAM HCL 2 MG/2ML IJ SOLN
INTRAMUSCULAR | Status: DC | PRN
  Administered 2022-02-01: 2 mg via INTRAVENOUS

## 2022-02-01 SURGICAL SUPPLY — 46 items
CANNULA CAP OBTURATR AIRSEAL 8 (CAP) ×2 IMPLANT
CHLORAPREP W/TINT 26 (MISCELLANEOUS) ×2 IMPLANT
COVER TIP SHEARS 8 DVNC (MISCELLANEOUS) ×1 IMPLANT
COVER TIP SHEARS 8MM DA VINCI (MISCELLANEOUS) ×1
COVER WAND RF STERILE (DRAPES) ×1 IMPLANT
DECANTER SPIKE VIAL GLASS SM (MISCELLANEOUS) ×2 IMPLANT
DEFOGGER SCOPE WARMER CLEARIFY (MISCELLANEOUS) ×2 IMPLANT
DERMABOND ADVANCED (GAUZE/BANDAGES/DRESSINGS) ×1
DERMABOND ADVANCED .7 DNX12 (GAUZE/BANDAGES/DRESSINGS) ×1 IMPLANT
DRAPE ARM DVNC X/XI (DISPOSABLE) ×3 IMPLANT
DRAPE COLUMN DVNC XI (DISPOSABLE) ×1 IMPLANT
DRAPE DA VINCI XI ARM (DISPOSABLE) ×3
DRAPE DA VINCI XI COLUMN (DISPOSABLE) ×1
ELECT REM PT RETURN 9FT ADLT (ELECTROSURGICAL) ×2
ELECTRODE REM PT RTRN 9FT ADLT (ELECTROSURGICAL) ×1 IMPLANT
GLOVE SURG ORTHO LTX SZ7.5 (GLOVE) ×4 IMPLANT
GOWN STRL REUS W/ TWL LRG LVL3 (GOWN DISPOSABLE) ×4 IMPLANT
GOWN STRL REUS W/TWL LRG LVL3 (GOWN DISPOSABLE) ×4
GRASPER SUT TROCAR 14GX15 (MISCELLANEOUS) IMPLANT
IRRIGATION STRYKERFLOW (MISCELLANEOUS) IMPLANT
IRRIGATOR STRYKERFLOW (MISCELLANEOUS)
IV NS IRRIG 3000ML ARTHROMATIC (IV SOLUTION) IMPLANT
KIT PINK PAD W/HEAD ARE REST (MISCELLANEOUS) ×2
KIT PINK PAD W/HEAD ARM REST (MISCELLANEOUS) ×1 IMPLANT
KIT TURNOVER KIT A (KITS) ×2 IMPLANT
MANIFOLD NEPTUNE II (INSTRUMENTS) ×2 IMPLANT
MESH VENTRALIGHT ST 4.5IN (Mesh General) ×1 IMPLANT
NDL INSUFFLATION 14GA 120MM (NEEDLE) ×1 IMPLANT
NEEDLE HYPO 22GX1.5 SAFETY (NEEDLE) ×2 IMPLANT
NEEDLE INSUFFLATION 14GA 120MM (NEEDLE) ×2 IMPLANT
NS IRRIG 500ML POUR BTL (IV SOLUTION) ×2 IMPLANT
PACK LAP CHOLECYSTECTOMY (MISCELLANEOUS) ×2 IMPLANT
SCISSORS METZENBAUM CVD 33 (INSTRUMENTS) IMPLANT
SEAL CANN UNIV 5-8 DVNC XI (MISCELLANEOUS) ×2 IMPLANT
SEAL XI 5MM-8MM UNIVERSAL (MISCELLANEOUS) ×2
SET TUBE FILTERED XL AIRSEAL (SET/KITS/TRAYS/PACK) ×2 IMPLANT
SOLUTION ELECTROLUBE (MISCELLANEOUS) ×2 IMPLANT
SUT MNCRL 4-0 (SUTURE) ×1
SUT MNCRL 4-0 27XMFL (SUTURE) ×1
SUT STRATAFIX 0 PDS+ CT-2 23 (SUTURE) ×2
SUT VICRYL 0 AB UR-6 (SUTURE) ×2 IMPLANT
SUT VLOC 90 S/L VL9 GS22 (SUTURE) ×4 IMPLANT
SUTURE MNCRL 4-0 27XMF (SUTURE) ×1 IMPLANT
SUTURE STRATFX 0 PDS+ CT-2 23 (SUTURE) ×1 IMPLANT
TROCAR Z-THREAD FIOS 11X100 BL (TROCAR) ×2 IMPLANT
WATER STERILE IRR 500ML POUR (IV SOLUTION) ×2 IMPLANT

## 2022-02-01 NOTE — Transfer of Care (Signed)
Immediate Anesthesia Transfer of Care Note  Patient: SHERRICE CREEKMORE  Procedure(s) Performed: XI ROBOTIC ASSISTED VENTRAL HERNIA, incarcerated incisional hernia (Abdomen) INSERTION OF MESH (Abdomen)  Patient Location: PACU  Anesthesia Type:General  Level of Consciousness: drowsy  Airway & Oxygen Therapy: Patient Spontanous Breathing and Patient connected to face mask  Post-op Assessment: Report given to RN and Post -op Vital signs reviewed and stable  Post vital signs: Reviewed and stable  Last Vitals:  Vitals Value Taken Time  BP 133/89 02/01/22 1035  Temp 36.6 C 02/01/22 1035  Pulse 87 02/01/22 1040  Resp 24 02/01/22 1040  SpO2 100 % 02/01/22 1040  Vitals shown include unvalidated device data.  Last Pain:  Vitals:   02/01/22 1035  TempSrc:   PainSc: Asleep      Patients Stated Pain Goal: 0 (14/10/30 1314)  Complications: No notable events documented.

## 2022-02-01 NOTE — Interval H&P Note (Signed)
History and Physical Interval Note:  02/01/2022 8:01 AM  Deborah Jackson  has presented today for surgery, with the diagnosis of incarcerated AA hernia 4 cm.  The various methods of treatment have been discussed with the patient and family. After consideration of risks, benefits and other options for treatment, the patient has consented to  Procedure(s): XI ROBOTIC ASSISTED VENTRAL HERNIA, incarcerated incisional hernia (N/A) as a surgical intervention.  The patient's history has been reviewed, patient examined, no change in status, stable for surgery.  I have reviewed the patient's chart and labs.  Questions were answered to the patient's satisfaction.     Ronny Bacon

## 2022-02-01 NOTE — Op Note (Signed)
Robotic assisted laparoscopic ventral hernia Repair IPOM using  round ventralight BARD mesh   Pre-operative Diagnosis: ventral hernia   Post-operative Diagnosis: same   Surgeon:  Ronny Bacon, M.D., FACS   Anesthesia: Gen. with endotracheal tube   Findings: 3.5 to 4 cm anterior abdominal wall fascial defect just cephalad to the umbilicus.  Incarcerated fatty tissue consistent with incarcerated omentum.  Estimated Blood Loss: 15 cc   Complications: none           Procedure Details  The patient was seen again in the Holding Room. The benefits, complications, treatment options, and expected outcomes were discussed with the patient. The risks of bleeding, infection, recurrence of symptoms, failure to resolve symptoms, bowel injury, mesh placement, mesh infection, any of which could require further surgery were reviewed with the patient. The likelihood of improving the patient's symptoms with return to their baseline status is good.  The patient and/or family concurred with the proposed plan, giving informed consent.  The patient was taken to Operating Room, identified and the procedure verified.  A Time Out was held and the above information confirmed.   Prior to the induction of general anesthesia, antibiotic prophylaxis was administered. VTE prophylaxis was in place. General endotracheal anesthesia was then administered and tolerated well. After the induction, the abdomen was prepped with Chloraprep and draped in the sterile fashion. The patient was positioned in the supine position. After local infiltration of quarter percent Marcaine with epinephrine, stab incision was made left upper quadrant.  Just below the costal margin approximately midclavicular line the Veress needle is passed with sensation of the layers to penetrate the abdominal wall and into the peritoneum.  Saline drop test is confirmed peritoneal placement.  Insufflation is initiated with carbon dioxide to pressures of 15  mmHg. Marcaine quarter percent with Exparel was used to inject all the incision sites. We used a left upper quadrant subcostal incision and using an optical FIOS 11 mm trocar, it was inserted with direct visualization and pneumoperitoneum obtained.  No hemodynamic compromise, no evidence of intra-abdominal injury. Two additional 8 mm ports were placed under direct visualization on the left lateral abdomen.  I visualized the hernia and there was a ventral hernia measuring approximately 3.5-4 cm.    The robot was brought to the surgical field and docked in the standard fashion.  We made sure that all instrumentation was kept under direct vision at all times and there was no collision between the arms.  I scrubbed out and went to the console. The omental incarcerated contents were extracted with the robotic graspers.  Lysis of adhesions necessary to complete reduction. Falciform and adjacent preperitoneal adipose and umbilical ligaments and hernia sac were taken down with electrocautery to allow adequate mesh placement to be secured to the fascial tissue. Confirm and measured that the defect was just under 4 cm, greater than 3.5 cm.  Using a 0 V-lock suture we closed the ventral defect primarily.   I used the same suture to secure the left 1.6 cm Bard Ventralight ST mesh and centered it over the fascial closure.  Using air seal I reduced the intraperitoneal pressure to 6 mmHg, during the fascial closure. The mesh was secured circumferentially to the abdominal wall using 2-0 V-lock in the standard fashion.   The mesh appeared well secured against the  abdominal wall. A second look laparoscopy revealed no evidence of intra-abdominal injury.    All the needles were removed under direct visualization.  The instruments were removed and  the robot was undocked.  The laparoscopic ports were removed under direct visualization and the pneumoperitoneum was deflated.   The fascial sutures approximated in the  standard fashion,  utilizing the PMI under direct visualization. Incisions were closed with  4-0 Monocryl   Dermabond was used to coat the skin.  Patient tolerated procedure well and there were no immediate complications. Needle and laparotomy counts were correct    Ronny Bacon M.D., [facility] 02/01/2022 10:38 AM

## 2022-02-01 NOTE — Discharge Instructions (Signed)

## 2022-02-01 NOTE — Anesthesia Procedure Notes (Addendum)
Procedure Name: Intubation Date/Time: 02/01/2022 8:53 AM Performed by: Loletha Grayer, CRNA Pre-anesthesia Checklist: Patient identified, Patient being monitored, Timeout performed, Emergency Drugs available and Suction available Patient Re-evaluated:Patient Re-evaluated prior to induction Oxygen Delivery Method: Circle system utilized Preoxygenation: Pre-oxygenation with 100% oxygen Induction Type: IV induction Ventilation: Mask ventilation without difficulty Laryngoscope Size: 3 and McGraph Grade View: Grade I Tube type: Oral Tube size: 7.0 mm Number of attempts: 1 Airway Equipment and Method: Stylet Placement Confirmation: ETT inserted through vocal cords under direct vision, positive ETCO2 and breath sounds checked- equal and bilateral Secured at: 21 cm Tube secured with: Tape Dental Injury: Teeth and Oropharynx as per pre-operative assessment

## 2022-02-01 NOTE — Anesthesia Preprocedure Evaluation (Signed)
Anesthesia Evaluation  Patient identified by MRN, date of birth, ID band Patient awake    Reviewed: Allergy & Precautions, H&P , NPO status , Patient's Chart, lab work & pertinent test results, reviewed documented beta blocker date and time   History of Anesthesia Complications (+) PONV and history of anesthetic complications  Airway Mallampati: II  TM Distance: >3 FB Neck ROM: full    Dental  (+) Teeth Intact   Pulmonary neg pulmonary ROS,    Pulmonary exam normal        Cardiovascular Exercise Tolerance: Good hypertension, On Medications negative cardio ROS Normal cardiovascular exam Rhythm:regular Rate:Normal     Neuro/Psych negative neurological ROS  negative psych ROS   GI/Hepatic negative GI ROS, Neg liver ROS,   Endo/Other  negative endocrine ROS  Renal/GU negative Renal ROS  negative genitourinary   Musculoskeletal   Abdominal   Peds  Hematology negative hematology ROS (+)   Anesthesia Other Findings Past Medical History: No date: Breast cancer ([place])     Comment:  left No date: Hypertension No date: PONV (postoperative nausea and vomiting) Past Surgical History: No date: APPENDECTOMY 2014: BREAST SURGERY; Left     Comment:  mastectomy w/ lymph node removal 2013: COLONOSCOPY     Comment:  normal No date: PARTIAL HYSTERECTOMY BMI    Body Mass Index: 30.07 kg/m     Reproductive/Obstetrics negative OB ROS                             Anesthesia Physical Anesthesia Plan  ASA: 3  Anesthesia Plan: General ETT   Post-op Pain Management:    Induction:   PONV Risk Score and Plan:   Airway Management Planned:   Additional Equipment:   Intra-op Plan:   Post-operative Plan:   Informed Consent: I have reviewed the patients History and Physical, chart, labs and discussed the procedure including the risks, benefits and alternatives for the proposed anesthesia with the  patient or authorized representative who has indicated his/her understanding and acceptance.     Dental Advisory Given  Plan Discussed with: CRNA  Anesthesia Plan Comments:         Anesthesia Quick Evaluation

## 2022-02-02 ENCOUNTER — Other Ambulatory Visit: Payer: Self-pay | Admitting: Surgery

## 2022-02-02 DIAGNOSIS — K430 Incisional hernia with obstruction, without gangrene: Secondary | ICD-10-CM

## 2022-02-02 MED ORDER — OXYCODONE-ACETAMINOPHEN 5-325 MG PO TABS: 1.0000 | ORAL_TABLET | Freq: Four times a day (QID) | ORAL | 0 refills | Status: DC | PRN

## 2022-02-03 NOTE — Anesthesia Postprocedure Evaluation (Signed)
Anesthesia Post Note  Patient: KAELYNN IGO  Procedure(s) Performed: XI ROBOTIC ASSISTED VENTRAL HERNIA, incarcerated incisional hernia (Abdomen) INSERTION OF MESH (Abdomen)  Patient location during evaluation: PACU Anesthesia Type: General Level of consciousness: awake and alert Pain management: pain level controlled Vital Signs Assessment: post-procedure vital signs reviewed and stable Respiratory status: spontaneous breathing, nonlabored ventilation, respiratory function stable and patient connected to nasal cannula oxygen Cardiovascular status: blood pressure returned to baseline and stable Postop Assessment: no apparent nausea or vomiting Anesthetic complications: no   No notable events documented.   Last Vitals:  Vitals:   02/01/22 1127 02/01/22 1143  BP: (!) 145/86 135/79  Pulse: 79 73  Resp: 17 18  Temp: 36.7 C 36.6 C  SpO2: 98% 97%    Last Pain:  Vitals:   02/01/22 1143  TempSrc: Temporal  PainSc: 0-No pain                 Molli Barrows

## 2022-02-14 ENCOUNTER — Ambulatory Visit (INDEPENDENT_AMBULATORY_CARE_PROVIDER_SITE_OTHER): Payer: No Typology Code available for payment source | Admitting: Surgery

## 2022-02-14 ENCOUNTER — Encounter: Payer: Self-pay | Admitting: Surgery

## 2022-02-14 ENCOUNTER — Other Ambulatory Visit: Payer: Self-pay

## 2022-02-14 VITALS — BP 154/93 | HR 70 | Temp 98.6°F | Ht 67.0 in | Wt 177.2 lb

## 2022-02-14 DIAGNOSIS — Z9889 Other specified postprocedural states: Secondary | ICD-10-CM

## 2022-02-14 DIAGNOSIS — Z09 Encounter for follow-up examination after completed treatment for conditions other than malignant neoplasm: Secondary | ICD-10-CM

## 2022-02-14 DIAGNOSIS — K430 Incisional hernia with obstruction, without gangrene: Secondary | ICD-10-CM | POA: Diagnosis not present

## 2022-02-14 DIAGNOSIS — Z8719 Personal history of other diseases of the digestive system: Secondary | ICD-10-CM | POA: Insufficient documentation

## 2022-02-14 NOTE — Patient Instructions (Addendum)
You may purchase an Abdominal binder at the [facility] or drug store.   GENERAL POST-OPERATIVE PATIENT INSTRUCTIONS   WOUND CARE INSTRUCTIONS:  Keep a dry clean dressing on the wound if there is drainage. The initial bandage may be removed after 24 hours.  Once the wound has quit draining you may leave it open to air.  If clothing rubs against the wound or causes irritation and the wound is not draining you may cover it with a dry dressing during the daytime.  Try to keep the wound dry and avoid ointments on the wound unless directed to do so.  If the wound becomes bright red and painful or starts to drain infected material that is not clear, please contact your physician immediately.  If the wound is mildly pink and has a thick firm ridge underneath it, this is normal, and is referred to as a healing ridge.  This will resolve over the next 4-6 weeks.  BATHING: You may shower if you have been informed of this by your surgeon. However, Please do not submerge in a tub, hot tub, or pool until incisions are completely sealed or have been told by your surgeon that you may do so.  DIET:  You may eat any foods that you can tolerate.  It is a good idea to eat a high fiber diet and take in plenty of fluids to prevent constipation.  If you do become constipated you may want to take a mild laxative or take ducolax tablets on a daily basis until your bowel habits are regular.  Constipation can be very uncomfortable, along with straining, after recent surgery.  ACTIVITY:  You are encouraged to cough and deep breath or use your incentive spirometer if you were given one, every 15-30 minutes when awake.  This will help prevent respiratory complications and low grade fevers post-operatively if you had a general anesthetic.  You may want to hug a pillow when coughing and sneezing to add additional support to the surgical area, if you had abdominal or chest surgery, which will decrease pain during these times.  You are  encouraged to walk and engage in light activity for the next two weeks.  You should not lift more than 20 pounds, until 03/15/2022 as it could put you at increased risk for complications.  Twenty pounds is roughly equivalent to a plastic bag of groceries. At that time- Listen to your body when lifting, if you have pain when lifting, stop and then try again in a few days. Soreness after doing exercises or activities of daily living is normal as you get back in to your normal routine.  MEDICATIONS:  Try to take narcotic medications and anti-inflammatory medications, such as tylenol, ibuprofen, naprosyn, etc., with food.  This will minimize stomach upset from the medication.  Should you develop nausea and vomiting from the pain medication, or develop a rash, please discontinue the medication and contact your physician.  You should not drive, make important decisions, or operate machinery when taking narcotic pain medication.  SUNBLOCK Use sun block to incision area over the next year if this area will be exposed to sun. This helps decrease scarring and will allow you avoid a permanent darkened area over your incision.  QUESTIONS:  Please feel free to call our office if you have any questions, and we will be glad to assist you. [phone]

## 2022-02-14 NOTE — Progress Notes (Signed)
[facility] POST-OP OFFICE VISIT  02/14/2022  HPI: Deborah Jackson is a 66 y.o. female 13 days s/p robotic repair of ventral hernia.  She was not able to get her pain medications immediately postop, she has since obtained them and has had her pain under better control.  The pain was much more than she had anticipated preop.  She otherwise denies any fevers, chills, nausea or vomiting.  She reports normal bowel activity.  Vital signs: There were no vitals taken for this visit.   Physical Exam: Constitutional: She appears well. Abdomen: The hernia sac area is somewhat tender to touch.  The but there is no evidence of induration, hematoma or seroma present currently. Skin: Incisions are clean, dry and intact.  Assessment/Plan: This is a 66 y.o. female 13 days s/p robotic repair of ventral hernia.  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  I believe she may drive at this time as well.  I believe she is reluctant to resume her work at [employer], knowing that there is no light duty available.  We will have her back in a month to assess her progress with her pain and at that time discuss her return to work.   Ronny Bacon M.D., FACS 02/14/2022, 2:05 PM

## 2022-03-14 ENCOUNTER — Other Ambulatory Visit: Payer: Self-pay

## 2022-03-14 ENCOUNTER — Ambulatory Visit (INDEPENDENT_AMBULATORY_CARE_PROVIDER_SITE_OTHER): Payer: No Typology Code available for payment source | Admitting: Surgery

## 2022-03-14 ENCOUNTER — Encounter: Payer: Self-pay | Admitting: Surgery

## 2022-03-14 VITALS — BP 148/90 | HR 61 | Temp 98.3°F | Ht 67.0 in | Wt 176.0 lb

## 2022-03-14 DIAGNOSIS — Z8719 Personal history of other diseases of the digestive system: Secondary | ICD-10-CM

## 2022-03-14 DIAGNOSIS — K430 Incisional hernia with obstruction, without gangrene: Secondary | ICD-10-CM | POA: Diagnosis not present

## 2022-03-14 DIAGNOSIS — Z09 Encounter for follow-up examination after completed treatment for conditions other than malignant neoplasm: Secondary | ICD-10-CM

## 2022-03-14 NOTE — Patient Instructions (Signed)
You may get Miralax (over the counter) and take it 3 times daily for constipation.  ? ? ? ?Advised to pursue a goal of 25 to 30 g of fiber daily.  Made aware that the majority of this may be through natural sources, but advised to be aware of actual consumption and to ensure minimal consumption by daily supplementation.  Various forms of supplements discussed.  Recommended Psyllium husk, that mixes well with applesauce, or the powder which goes down well shaken with chocolate milk.  ?Strongly advised to consume more fluids to ensure adequate hydration, instructed to watch color of urine to determine adequacy of hydration.  Clarity is pursued in urine output, and bowel activity that correlates to significant meal intake.   ?We need to avoid deferring having bowel movements, advised to take the time at the first sign of sensation, typically following meals, and in the morning.   ?Subsequent utilization of MiraLAX may be needed ensure at least daily movement, ideally twice daily bowel movements.  If multiple doses of MiraLAX are necessary utilize them. ?Never skip a day...  ?To be regular, we must do the above EVERY day.  ? ? ?If you have any concerns or questions, please feel free to call our office.  ? ?Laparoscopic Ventral Hernia Repair, Care After ?The following information offers guidance on how to care for yourself after your procedure. Your health care provider may also give you more specific instructions. If you have problems or questions, contact your health care provider. ?What can I expect after the procedure? ?After the procedure, it is common to have pain, discomfort, or soreness. ?Follow these instructions at home: ?Medicines ?Take over-the-counter and prescription medicines only as told by your health care provider. ?Ask your health care provider if the medicine prescribed to you: ?Requires you to avoid driving or using machinery. ?Can cause constipation. You may need to take these actions to prevent or  treat constipation: ?Drink enough fluid to keep your urine pale yellow. ?Take over-the-counter or prescription medicines. ?Eat foods that are high in fiber, such as beans, whole grains, and fresh fruits and vegetables. ?Limit foods that are high in fat and processed sugars, such as fried or sweet foods. ?Incision care ? ?Follow instructions from your health care provider about how to take care of your incisions. Make sure you: ?Wash your hands with soap and water for at least 20 seconds before and after you change your bandage (dressing) or before you touch your abdomen. If soap and water are not available, use hand sanitizer. ?Change your dressing as told by your health care provider. ?Leave stitches (sutures), skin glue, or adhesive strips in place. These skin closures may need to stay in place for 2 weeks or longer. If adhesive strip edges start to loosen and curl up, you may trim the loose edges. Do not remove adhesive strips completely unless your health care provider tells you to do that. ?Check your incision areas every day for signs of infection. Check for: ?More redness, swelling, or pain. ?Fluid or blood. ?Warmth. ?Pus or a bad smell. ?Bathing ? ?Do not take baths, swim, or use a hot tub until your health care provider approves. Ask your health care provider if you may take showers. You may only be allowed to take sponge baths. ?Keep your dressing dry until your health care provider says it can be removed. ?Activity ? ?Rest as told by your health care provider. ?Avoid sitting for a long time without moving. Get up to take  short walks every 1-2 hours. This is important to improve blood flow and breathing. Ask for help if you feel weak or unsteady. ?Do not lift anything that is heavier than 10 lb (4.5 kg), or the limit that you are told, until your health care provider says that it is safe. ?If you were given a sedative during the procedure, it can affect you for several hours. Do not drive or operate  machinery until your health care provider says that it is safe. ?Return to your normal activities as told by your health care provider. Ask your health care provider what activities are safe for you. ?General instructions ? ?Hold a pillow over your abdomen when you cough or sneeze. This helps with pain. ?Do not use any products that contain nicotine or tobacco. These products include cigarettes, chewing tobacco, and vaping devices, such as e-cigarettes. These can delay healing after surgery. If you need help quitting, ask your health care provider. ?You may be asked to continue to do deep breathing exercises at home. This will help to prevent a lung infection. ?Keep all follow-up visits. This is important. ?Contact a health care provider if: ?You have any of these signs of infection: ?More redness, swelling, or pain around an incision. ?Fluid or blood coming from an incision. ?Warmth coming from an incision. ?Pus or a bad smell coming from an incision. ?A fever or chills. ?You have pain that gets worse or does not get better with medicine. ?You have nausea or vomiting. ?You have a cough. ?You have shortness of breath. ?You have not had a bowel movement in 3 days. ?You are not able to urinate. ?Get help right away if you have: ?Severe pain in your abdomen. ?Persistent nausea and vomiting. ?Redness, warmth, or pain in your leg. ?Chest pain. ?Trouble breathing. ?These symptoms may represent a serious problem that is an emergency. Do not wait to see if the symptoms will go away. Get medical help right away. Call your local emergency services (911 in the [place] Do not drive yourself to the hospital. ?Summary ?After this procedure, it is common to have pain, discomfort, or soreness. ?Follow instructions from your health care provider about how to take care of your incision. ?Check your incision area every day for signs of infection. Report any signs of infection to your health care provider. ?Keep all follow-up visits.  This is important. ?This information is not intended to replace advice given to you by your health care provider. Make sure you discuss any questions you have with your health care provider. ?Document Revised: 07/28/2020 Document Reviewed: 07/28/2020 ?Elsevier Patient Education ? [place]. ? ?

## 2022-03-14 NOTE — Progress Notes (Signed)
[facility] ?POST-OP OFFICE VISIT ? ?03/14/2022 ? ?HPI: ?Deborah Jackson is a 66 y.o. female 6 weeks s/p robotic ventral hernia repair.  She actually has minimal complaints on initial questioning.  Reports that she is still sore at the incisions and over the hernia site repair.  She denies nausea, vomiting, fevers or chills.  She reports she is constipated moving her bowels once weekly, but feels she does not have a need to move it any other time.  She reports she does some sort of pelvic manipulation she may have a rectocele that she supports perhaps to make a bowel movement happen.  I feel she is somewhat vague at her descriptive terms of how she has to move her bowels. ? ?Vital signs: ?BP (!) 148/90   Pulse 61   Temp 98.3 ?F (36.8 ?C) (Oral)   Ht '5\' 7"'$  (1.702 m)   Wt 176 lb (79.8 kg)   SpO2 99%   BMI 27.57 kg/m?   ? ?Physical Exam: ?Constitutional: She appears well. ?Abdomen: Soft aside from periumbilical and incisional subjective tenderness. ?Skin: Incisions are well-healed.  Skin looks good ? ?Assessment/Plan: ?This is a 66 y.o. female 6 weeks s/p robotic ventral hernia repair. ?Constipation. ? ?Patient Active Problem List  ? Diagnosis Date Noted  ? Status post laparoscopic hernia repair 02/14/2022  ? Other neutropenia ([place]) 12/12/2017  ? Essential thrombocythemia ([place]) 10/17/2015  ? Benign hypertension 10/17/2015  ? Primary malignant neoplasm of female breast ([place]) 10/17/2015  ? Temporary cerebral vascular dysfunction 10/17/2015  ? 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 Hx of breast reduction, elective 04/15/2013  ? ? - Advised to pursue a goal of 25 to 30 g of fiber daily.  Made aware that the majority of this may be through natural sources, but advised to be aware of actual consumption and to ensure minimal consumption by daily supplementation.  Various forms of supplements discussed.  Recommended Psyllium husk, that mixes well with applesauce, or the powder which  goes down well shaken with chocolate milk.  ?Strongly advised to consume more fluids to ensure adequate hydration, instructed to watch color of urine to determine adequacy of hydration.  Clarity is pursued in urine output, and bowel activity that correlates to significant meal intake.   ?We need to avoid deferring having bowel movements, advised to take the time at the first sign of sensation, typically following meals, and in the morning.   ?Subsequent utilization of MiraLAX may be needed ensure at least daily movement, ideally twice daily bowel movements.  If multiple doses of MiraLAX are necessary utilize them. ?Never skip a day...  ?To be regular, we must do the above EVERY day.   ? ?She is 6 weeks out from surgery and has maximal strength of healing from her hernia repair.  Anticipate she will be able to return to work without restriction.  Next date for her work would be April 1. ?She may follow-up as needed. ? ?Ronny Bacon M.D., FACS ?03/14/2022, 1:35 PM  ?

## 2022-03-18 ENCOUNTER — Telehealth: Payer: Self-pay

## 2022-03-18 NOTE — Telephone Encounter (Signed)
Faxed medical records to [facility] at [phone]. ?

## 2022-10-25 ENCOUNTER — Ambulatory Visit (LOCAL_COMMUNITY_HEALTH_CENTER): Payer: Medicare HMO

## 2022-10-25 DIAGNOSIS — Z23 Encounter for immunization: Secondary | ICD-10-CM | POA: Diagnosis not present

## 2022-10-25 DIAGNOSIS — Z719 Counseling, unspecified: Secondary | ICD-10-CM

## 2022-12-14 ENCOUNTER — Ambulatory Visit
Admission: EM | Admit: 2022-12-14 | Discharge: 2022-12-14 | Disposition: A | Discharge status: Home / Self Care | Payer: Medicare HMO | Attending: Physician Assistant | Admitting: Physician Assistant

## 2022-12-14 ENCOUNTER — Encounter: Payer: Self-pay | Admitting: Emergency Medicine

## 2022-12-14 DIAGNOSIS — R051 Acute cough: Secondary | ICD-10-CM | POA: Diagnosis not present

## 2022-12-14 DIAGNOSIS — R509 Fever, unspecified: Secondary | ICD-10-CM | POA: Diagnosis not present

## 2022-12-14 DIAGNOSIS — U071 COVID-19: Secondary | ICD-10-CM | POA: Diagnosis not present

## 2022-12-14 LAB — SARS CORONAVIRUS 2 BY RT PCR: SARS Coronavirus 2 by RT PCR: POSITIVE — AB

## 2022-12-14 LAB — RAPID INFLUENZA A&B ANTIGENS
Influenza A (ARMC): NEGATIVE
Influenza B (ARMC): NEGATIVE

## 2022-12-14 MED ORDER — MOLNUPIRAVIR 200 MG PO CAPS: 4.0000 | ORAL_CAPSULE | Freq: Two times a day (BID) | ORAL | 0 refills | Status: AC

## 2022-12-14 MED ORDER — ACETAMINOPHEN 325 MG PO TABS
650.0000 mg | ORAL_TABLET | Freq: Once | ORAL | Status: AC
  Administered 2022-12-14: 650 mg via ORAL

## 2022-12-14 MED ORDER — PROMETHAZINE-DM 6.25-15 MG/5ML PO SYRP: 5.0000 mL | ORAL_SOLUTION | Freq: Four times a day (QID) | ORAL | 0 refills | Status: DC | PRN

## 2022-12-14 NOTE — ED Triage Notes (Signed)
Patient c/o cough, congestion and runny nose and chills that  started last night.  Patient unsure of fevers.

## 2022-12-14 NOTE — Discharge Instructions (Signed)
-  You have COVID.  Isolate till Thursday and then wear mask x 5 days.  I sent antiviral medicine for you as well as cough medicine.  Plenty rest and fluids. - You will need to be seen again if you have any uncontrolled fever, weakness or shortness of breath.  Expect to feel poorly for the next few days but you should be improving after that.

## 2022-12-14 NOTE — ED Provider Notes (Signed)
[facility]    CSN: [id number] Arrival date & time: 12/14/22  1500      History   Chief Complaint Chief Complaint  Patient presents with   Nasal Congestion   Cough    HPI Deborah Jackson is a 66 y.o. female presenting for cough, congestion, fever, fatigue and bodyaches as well as chills that began last night.  Denies sore throat, sinus pain, chest pain, shortness of breath.  No vomiting or diarrhea.  Patient says she just feels terrible.  She has not taken anything for symptoms.  Her temp is currently 100.8 degrees.  No known sick contacts.  Medical history significant for hypertension and breast cancer.  No other complaints.  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   Hx of breast reduction, elective 04/15/2013    Past Surgical History:  Procedure Laterality Date   APPENDECTOMY     BREAST SURGERY Left 2014   mastectomy w/ lymph node removal   COLONOSCOPY  2013   normal   INSERTION OF MESH N/A 02/01/2022   Procedure: INSERTION OF MESH;  Surgeon: Ronny Bacon, MD;  Location: [facility] ORS;  Service: General;  Laterality: N/A;   PARTIAL HYSTERECTOMY     XI ROBOTIC ASSISTED VENTRAL HERNIA N/A 02/01/2022   Procedure: XI ROBOTIC ASSISTED VENTRAL HERNIA, incarcerated incisional hernia;  Surgeon: Ronny Bacon, MD;  Location: [facility];  Service: General;  Laterality: N/A;    OB History   No obstetric history on file.      Home Medications    Prior to Admission  medications   Medication Sig Start Date End Date Taking? Authorizing Provider  molnupiravir EUA (LAGEVRIO) 200 MG CAPS capsule Take 4 capsules (800 mg total) by mouth 2 (two) times daily for 5 days. 12/14/22 12/19/22 Yes Danton Clap, PA-C  promethazine-dextromethorphan (PROMETHAZINE-DM) 6.25-15 MG/5ML syrup Take 5 mLs by mouth 4 (four) times daily as needed. 12/14/22  Yes Danton Clap, PA-C  allopurinol (ZYLOPRIM) 100 MG tablet Take 100 mg by mouth daily. 08/31/21   [provider]  amLODipine (NORVASC) 10 MG tablet Take 1 tablet by mouth daily. 01/03/15   [provider]  aspirin 81 MG chewable tablet Chew 81 mg by mouth.    [provider]  Dextromethorphan-guaiFENesin ([employer] MUCUS DM EXTENDED RELEASE) 30-600 MG TB12 Take 1 tablet by mouth every 12 (twelve) hours as needed. 01/28/22   [provider]  ibuprofen (ADVIL,MOTRIN) 800 MG tablet Take 1 tablet (800 mg total) by mouth every 8 (eight) hours as needed. 05/15/17   Fisher, Linden Dolin, PA-C  indomethacin (INDOCIN) 50 MG capsule Take 50 mg by mouth 2 (two) times daily between meals as needed.    [provider]  lisinopril-hydrochlorothiazide (PRINZIDE,ZESTORETIC) 20-25 MG tablet Take 1 tablet by mouth daily. 01/03/15   [provider]  naproxen (NAPROSYN) 500 MG tablet Take 1 tablet (500 mg total) by mouth 2 (two) times daily with a meal. 02/15/17   Lorin Picket, PA-C  ondansetron (ZOFRAN-ODT) 8 MG disintegrating tablet Take 1 tablet (8 mg total) by mouth every 8 (eight) hours as needed for nausea or vomiting. 01/17/22   Naaman Plummer, MD  pegaspargase (ONCASPAR) 750 UNIT/ML SOLN Inject into the muscle every 30 (thirty) days.    [provider]  PEGASYS 180 MCG/ML injection Inject into the skin. 02/04/22   [provider]    Family History Family History  Problem Relation Age of Onset   Hypertension Mother    Diabetes Mother    Gout Mother    Lung cancer Father     Stomach cancer Father    Diabetes Brother    Gout Brother    Hypertension Brother    Hypertension Brother     Social History Social History   Tobacco Use   Smoking status: Never   Smokeless tobacco: Never  Vaping Use   Vaping Use: Never used  Substance Use Topics   Alcohol use: No    Alcohol/week: 0.0 standard drinks of alcohol   Drug use: No     Allergies   Tape   Review of Systems Review of Systems  Constitutional:  Positive for chills, fatigue and fever. Negative for diaphoresis.  HENT:  Positive for congestion and rhinorrhea. Negative for ear pain, sinus pressure, sinus pain and sore throat.   Respiratory:  Positive for cough. Negative for shortness of breath.   Gastrointestinal:  Negative for abdominal pain, nausea and vomiting.  Musculoskeletal:  Positive for myalgias. Negative for arthralgias.  Skin:  Negative for rash.  Neurological:  Negative for weakness and headaches.  Hematological:  Negative for adenopathy.     Physical Exam Triage Vital Signs ED Triage Vitals  Enc Vitals Group     BP 12/14/22 1555 (!) 175/98     Pulse Rate 12/14/22 1555 87     Resp 12/14/22 1555 14     Temp 12/14/22 1555 (!) 100.8 F (38.2 C)     Temp Source 12/14/22 1555 Oral     SpO2 12/14/22 1555 99 %     Weight 12/14/22 1553 165 lb (74.8 kg)     Height 12/14/22 1553 '5\' 7"'$  (1.702 m)     Head Circumference --      Peak Flow --      Pain Score 12/14/22 1553 0     Pain Loc --      Pain Edu? --      Excl. in [place]? --    No data found.  Updated Vital Signs BP (!) 175/98 (BP Location: Left Arm)   Pulse 87   Temp (!) 100.8 F (38.2 C) (Oral)   Resp 14   Ht '5\' 7"'$  (1.702 m)   Wt 165 lb (74.8 kg)   SpO2 99%   BMI 25.84 kg/m    Physical Exam Vitals and nursing note reviewed.  Constitutional:      General: She is not in acute distress.    Appearance: Normal appearance. She is ill-appearing. She is not toxic-appearing.  HENT:     Head: Normocephalic and atraumatic.      Nose: Congestion present.     Mouth/Throat:     Mouth: Mucous membranes are moist.     Pharynx: Oropharynx is clear.  Eyes:     General: No scleral icterus.       Right eye: No discharge.        Left eye: No discharge.     Conjunctiva/sclera: Conjunctivae normal.  Cardiovascular:  Rate and Rhythm: Normal rate and regular rhythm.     Heart sounds: Normal heart sounds.  Pulmonary:     Effort: Pulmonary effort is normal. No respiratory distress.     Breath sounds: Normal breath sounds. No wheezing, rhonchi or rales.  Musculoskeletal:     Cervical back: Neck supple.  Skin:    General: Skin is dry.  Neurological:     General: No focal deficit present.     Mental Status: She is alert. Mental status is at baseline.     Motor: No weakness.     Gait: Gait normal.  Psychiatric:        Mood and Affect: Mood normal.        Behavior: Behavior normal.        Thought Content: Thought content normal.      UC Treatments / Results  Labs (all labs ordered are listed, but only abnormal results are displayed) Labs Reviewed  SARS CORONAVIRUS 2 BY RT PCR - Abnormal; Notable for the following components:      Result Value   SARS Coronavirus 2 by RT PCR POSITIVE (*)    All other components within normal limits  RAPID INFLUENZA A&B ANTIGENS    EKG   Radiology No results found.  Procedures Procedures (including critical care time)  Medications Ordered in UC Medications  acetaminophen (TYLENOL) tablet 650 mg (650 mg Oral Given 12/14/22 1557)    Initial Impression / Assessment and Plan / UC Course  I have reviewed the triage vital signs and the nursing notes.  Pertinent labs & imaging results that were available during my care of the patient were reviewed by me and considered in my medical decision making (see chart for details).   66 year old female presents for fever, fatigue, cough, congestion, chills, body aches that began yesterday.  Temp 100.8 degrees.  BP elevated  175/98.  She is not tachycardic.  She is well-appearing and nontoxic.  Exam shows nasal congestion.  Throat is clear.  Chest clear auscultation heart regular rhythm.  Rapid flu testing and PCR COVID obtained.  Positive COVID.  Discussed result with patient.  Advised of current CDC guidelines, isolation protocol and ED precautions.  Will treat with molnupiravir and Promethazine DM.  Reviewed return precautions.  Final Clinical Impressions(s) / UC Diagnoses   Final diagnoses:  COVID-19  Acute cough  Fever, unspecified     Discharge Instructions      -You have COVID.  Isolate till Thursday and then wear mask x 5 days.  I sent antiviral medicine for you as well as cough medicine.  Plenty rest and fluids. - You will need to be seen again if you have any uncontrolled fever, weakness or shortness of breath.  Expect to feel poorly for the next few days but you should be improving after that.     ED Prescriptions     Medication Sig Dispense Auth. Provider   molnupiravir EUA (LAGEVRIO) 200 MG CAPS capsule Take 4 capsules (800 mg total) by mouth 2 (two) times daily for 5 days. 40 capsule Danton Clap, PA-C   promethazine-dextromethorphan (PROMETHAZINE-DM) 6.25-15 MG/5ML syrup Take 5 mLs by mouth 4 (four) times daily as needed. 118 mL Danton Clap, PA-C      PDMP not reviewed this encounter.   Danton Clap, PA-C 12/14/22 [phone]

## 2023-02-08 LAB — GLUCOSE, POCT (MANUAL RESULT ENTRY): Glucose Fasting, POC: 96 mg/dL (ref 70–99)

## 2023-02-08 NOTE — Progress Notes (Signed)
PT is fasting

## 2023-02-17 ENCOUNTER — Encounter: Payer: Self-pay | Admitting: *Deleted

## 2023-02-17 NOTE — Progress Notes (Signed)
Pt has established PCP, Dr. Sharyn Creamer, with whom she has had ongoing care throughout the past rolling 12 months, and w/ whom she had an appointment for an AWV on 9//1/23. 02/08/23 screening event results wnl and no SDOH barriers noted as of this event. Pt has ongoing hematology/oncology specialty care also.No additional health equity team support indicated at this time.

## 2023-11-25 ENCOUNTER — Encounter: Payer: Self-pay | Admitting: Emergency Medicine

## 2023-11-25 ENCOUNTER — Ambulatory Visit
Admission: EM | Admit: 2023-11-25 | Discharge: 2023-11-25 | Disposition: A | Discharge status: Home / Self Care | Payer: Medicare HMO | Attending: Physician Assistant | Admitting: Physician Assistant

## 2023-11-25 DIAGNOSIS — R051 Acute cough: Secondary | ICD-10-CM

## 2023-11-25 DIAGNOSIS — J029 Acute pharyngitis, unspecified: Secondary | ICD-10-CM | POA: Diagnosis present

## 2023-11-25 DIAGNOSIS — J069 Acute upper respiratory infection, unspecified: Secondary | ICD-10-CM

## 2023-11-25 LAB — GROUP A STREP BY PCR: Group A Strep by PCR: NOT DETECTED

## 2023-11-25 LAB — SARS CORONAVIRUS 2 BY RT PCR: SARS Coronavirus 2 by RT PCR: NEGATIVE

## 2023-11-25 MED ORDER — IPRATROPIUM BROMIDE 0.06 % NA SOLN: 2.0000 | Freq: Four times a day (QID) | NASAL | 0 refills | Status: AC

## 2023-11-25 MED ORDER — PROMETHAZINE-DM 6.25-15 MG/5ML PO SYRP: 5.0000 mL | ORAL_SOLUTION | Freq: Four times a day (QID) | ORAL | 0 refills | Status: AC | PRN

## 2023-11-25 NOTE — ED Triage Notes (Signed)
Pt presents with a sore throat and cough x 1 week.

## 2023-11-25 NOTE — Discharge Instructions (Addendum)
-  Negative strep and COVID testing  URI/COLD SYMPTOMS: Your exam today is consistent with a viral illness. Antibiotics are not indicated at this time. Use medications as directed, including cough syrup, nasal saline, and decongestants. Your symptoms should improve over the next few days and resolve within 7-10 days. Increase rest and fluids. F/u if symptoms worsen or predominate such as sore throat, ear pain, productive cough, shortness of breath, or if you develop high fevers or worsening fatigue over the next several days.    -See re-evaluation if fever, worsening cough or shortness of breath.

## 2023-11-25 NOTE — ED Provider Notes (Signed)
[facility]    CSN: [id number] Arrival date & time: 11/25/23  1150      History   Chief Complaint Chief Complaint  Patient presents with   Sore Throat   Cough    HPI Deborah Jackson is a 67 y.o. female presenting for 5-6 day history of productive cough, fatigue, sweats, congestion and sore throat.  Denies known fever.  No ear pain, sinus pain.  Denies breathing difficulty or wheezing, vomiting or diarrhea.  Has been around her grandson who has had similar symptoms.  Patient has been taking Coricidin HBP over-the-counter.  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   Hx of breast reduction, elective 04/15/2013    Past Surgical History:  Procedure Laterality Date   APPENDECTOMY     BREAST SURGERY Left 2014   mastectomy w/ lymph node removal   COLONOSCOPY  2013   normal   INSERTION OF MESH N/A 02/01/2022   Procedure: INSERTION OF MESH;  Surgeon: Campbell Lerner, MD;  Location: [facility];  Service: General;  Laterality: N/A;   PARTIAL HYSTERECTOMY     XI ROBOTIC ASSISTED VENTRAL HERNIA N/A 02/01/2022   Procedure: XI ROBOTIC ASSISTED VENTRAL HERNIA, incarcerated incisional hernia;  Surgeon: Campbell Lerner, MD;  Location: [facility];  Service: General;  Laterality: N/A;    OB History   No obstetric history on file.      Home Medications    Prior to Admission medications   Medication Sig Start Date End Date Taking? Authorizing Provider  ipratropium (ATROVENT) 0.06 % nasal  spray Place 2 sprays into both nostrils 4 (four) times daily. 11/25/23  Yes Shirlee Latch, PA-C  promethazine-dextromethorphan (PROMETHAZINE-DM) 6.25-15 MG/5ML syrup Take 5 mLs by mouth 4 (four) times daily as needed. 11/25/23  Yes Shirlee Latch, PA-C  allopurinol (ZYLOPRIM) 100 MG tablet Take 100 mg by mouth daily. 08/31/21   [provider]  amLODipine (NORVASC) 10 MG tablet Take 1 tablet by mouth daily. 01/03/15   [provider]  aspirin 81 MG chewable tablet Chew 81 mg by mouth.    [provider]  Dextromethorphan-guaiFENesin ([employer] MUCUS DM EXTENDED RELEASE) 30-600 MG TB12 Take 1 tablet by mouth every 12 (twelve) hours as needed. 01/28/22   [provider]  ibuprofen (ADVIL,MOTRIN) 800 MG tablet Take 1 tablet (800 mg total) by mouth every 8 (eight) hours as needed. 05/15/17   Fisher, Roselyn Bering, PA-C  indomethacin (INDOCIN) 50 MG capsule Take 50 mg by mouth 2 (two) times daily between meals as needed.    [provider]  lisinopril-hydrochlorothiazide (PRINZIDE,ZESTORETIC) 20-25 MG tablet Take 1 tablet by mouth daily. 01/03/15   [provider]  naproxen (NAPROSYN) 500 MG tablet Take 1 tablet (500 mg total) by mouth 2 (two) times daily with a meal. 02/15/17   Lutricia Feil, PA-C  ondansetron (ZOFRAN-ODT) 8 MG disintegrating tablet Take 1 tablet (8 mg total) by mouth every 8 (eight) hours as needed for nausea or vomiting. 01/17/22  Merwyn Katos, MD  pegaspargase (ONCASPAR) 750 UNIT/ML SOLN Inject into the muscle every 30 (thirty) days.    [provider]  PEGASYS 180 MCG/ML injection Inject into the skin. 02/04/22   [provider]    Family History Family History  Problem Relation Age of Onset   Hypertension Mother    Diabetes Mother    Gout Mother    Lung cancer Father    Stomach cancer Father    Diabetes Brother    Gout Brother    Hypertension Brother    Hypertension Brother     Social History Social History    Tobacco Use   Smoking status: Never   Smokeless tobacco: Never  Vaping Use   Vaping status: Never Used  Substance Use Topics   Alcohol use: No    Alcohol/week: 0.0 standard drinks of alcohol   Drug use: No     Allergies   Tape   Review of Systems Review of Systems  Constitutional:  Positive for fatigue. Negative for chills, diaphoresis and fever.  HENT:  Positive for congestion, rhinorrhea and sore throat. Negative for ear pain, sinus pressure and sinus pain.   Respiratory:  Positive for cough. Negative for shortness of breath.   Cardiovascular:  Negative for chest pain.  Gastrointestinal:  Negative for abdominal pain, nausea and vomiting.  Musculoskeletal:  Negative for arthralgias and myalgias.  Skin:  Negative for rash.  Neurological:  Negative for weakness and headaches.  Hematological:  Negative for adenopathy.     Physical Exam Triage Vital Signs ED Triage Vitals  Encounter Vitals Group     BP      Systolic BP Percentile      Diastolic BP Percentile      Pulse      Resp      Temp      Temp src      SpO2      Weight      Height      Head Circumference      Peak Flow      Pain Score      Pain Loc      Pain Education      Exclude from Growth Chart    No data found.  Updated Vital Signs BP (!) 157/94 (BP Location: Right Arm)   Pulse (!) 56   Temp 98.1 F (36.7 C) (Oral)   Resp 16   SpO2 96%      Physical Exam Vitals and nursing note reviewed.  Constitutional:      General: She is not in acute distress.    Appearance: Normal appearance. She is well-developed. She is ill-appearing. She is not toxic-appearing.  HENT:     Head: Normocephalic and atraumatic.     Nose: Congestion present.     Mouth/Throat:     Mouth: Mucous membranes are moist.     Pharynx: Oropharynx is clear. Posterior oropharyngeal erythema present.  Eyes:     General: No scleral icterus.       Right eye: No discharge.        Left eye: No discharge.      Conjunctiva/sclera: Conjunctivae normal.  Cardiovascular:     Rate and Rhythm: Normal rate and regular rhythm.     Heart sounds: Normal heart sounds.  Pulmonary:     Effort: Pulmonary effort is normal. No respiratory distress.     Breath sounds: Normal breath sounds.  Musculoskeletal:     Cervical back: Neck supple.  Skin:    General: Skin is dry.  Neurological:     General: No focal deficit present.     Mental Status: She is alert. Mental status is at baseline.     Motor: No weakness.     Gait: Gait normal.  Psychiatric:        Mood and Affect: Mood normal.        Behavior: Behavior normal.        Thought Content: Thought content normal.      UC Treatments / Results  Labs (all labs ordered are listed, but only abnormal results are displayed) Labs Reviewed  GROUP A STREP BY PCR  SARS CORONAVIRUS 2 BY RT PCR    EKG   Radiology No results found.  Procedures Procedures (including critical care time)  Medications Ordered in UC Medications - No data to display  Initial Impression / Assessment and Plan / UC Course  I have reviewed the triage vital signs and the nursing notes.  Pertinent labs & imaging results that were available during my care of the patient were reviewed by me and considered in my medical decision making (see chart for details).   67 year old female presents for 5 to 6-day history of productive cough, congestion, sore throat.  Denies fever or breathing difficulty.  Taking Coricidin HBP.  Has been around her grandson who has been sick.  She is currently afebrile.  Appears ill and coughs frequently.  Nontoxic.  Cough is deep and forceful.  Mild nasal congestion and erythema posterior pharynx.  Chest is clear.  COVID and strep testing obtained.  Negative testing.  Viral illness.  Supportive care encouraged with increased rest and fluids.  I sent Atrovent nasal spray and Promethazine DM to pharmacy.  Encouraged her to return or seek reevaluation  elsewhere if she develops a fever, worsening cough or increased shortness of breath.   Final Clinical Impressions(s) / UC Diagnoses   Final diagnoses:  Viral upper respiratory tract infection  Acute cough  Sore throat     Discharge Instructions      -Negative strep and COVID testing  URI/COLD SYMPTOMS: Your exam today is consistent with a viral illness. Antibiotics are not indicated at this time. Use medications as directed, including cough syrup, nasal saline, and decongestants. Your symptoms should improve over the next few days and resolve within 7-10 days. Increase rest and fluids. F/u if symptoms worsen or predominate such as sore throat, ear pain, productive cough, shortness of breath, or if you develop high fevers or worsening fatigue over the next several days.    -See re-evaluation if fever, worsening cough or shortness of breath.     ED Prescriptions     Medication Sig Dispense Auth. Provider   ipratropium (ATROVENT) 0.06 % nasal spray Place 2 sprays into both nostrils 4 (four) times daily. 15 mL Eusebio Friendly B, PA-C   promethazine-dextromethorphan (PROMETHAZINE-DM) 6.25-15 MG/5ML syrup Take 5 mLs by mouth 4 (four) times daily as needed. 118 mL Shirlee Latch, PA-C      PDMP not reviewed this encounter.   Shirlee Latch, PA-C 11/25/23 1309
